# Patient Record
Sex: Female | Born: 1990 | Race: White | Hispanic: No | Marital: Single | Smoking: Never smoker
Health system: Southern US, Community
[De-identification: ages and names within clinical notes are randomized; demographics above are authoritative.]

## PROBLEM LIST (undated history)

## (undated) ENCOUNTER — Inpatient Hospital Stay: Payer: Self-pay

## (undated) DIAGNOSIS — F419 Anxiety disorder, unspecified: Secondary | ICD-10-CM

## (undated) DIAGNOSIS — J45909 Unspecified asthma, uncomplicated: Secondary | ICD-10-CM

## (undated) DIAGNOSIS — Z8489 Family history of other specified conditions: Secondary | ICD-10-CM

## (undated) DIAGNOSIS — F32A Depression, unspecified: Secondary | ICD-10-CM

## (undated) DIAGNOSIS — E282 Polycystic ovarian syndrome: Secondary | ICD-10-CM

## (undated) DIAGNOSIS — B009 Herpesviral infection, unspecified: Secondary | ICD-10-CM

## (undated) HISTORY — DX: Herpesviral infection, unspecified: B00.9

## (undated) HISTORY — DX: Depression, unspecified: F32.A

## (undated) HISTORY — DX: Anxiety disorder, unspecified: F41.9

## (undated) HISTORY — DX: Polycystic ovarian syndrome: E28.2

## (undated) HISTORY — PX: FOOT SURGERY: SHX648

## (undated) HISTORY — PX: FRACTURE SURGERY: SHX138

---
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
## 2013-05-13 IMAGING — CT CT ABD-PELV W/ CM
1 of 2 series · 15 of 32 positions shown, 19 images · IV contrast (OMNIPAQUE 300)
Comparison: CT of the abdomen and pelvis performed 02/07/2010

CLINICAL DATA: Right lower quadrant abdominal pain.

CT ABDOMEN AND PELVIS WITH CONTRAST
TECHNIQUE: Multidetector CT imaging of the abdomen and pelvis was
performed following the standard protocol during bolus
administration of intravenous contrast.
Contrast: 100mL OMNIPAQUE IOHEXOL 300 MG/ML  SOLN

[Series 2: abd/pel with · axial · 0.66mm/px · z∈[-444,-34]mm · 15 of 93 slices shown, 19 images]
[im 7/93  soft-tissue]
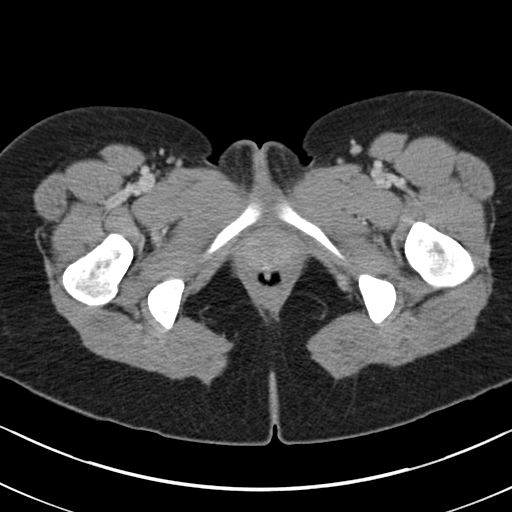
[im 7/93  bone]
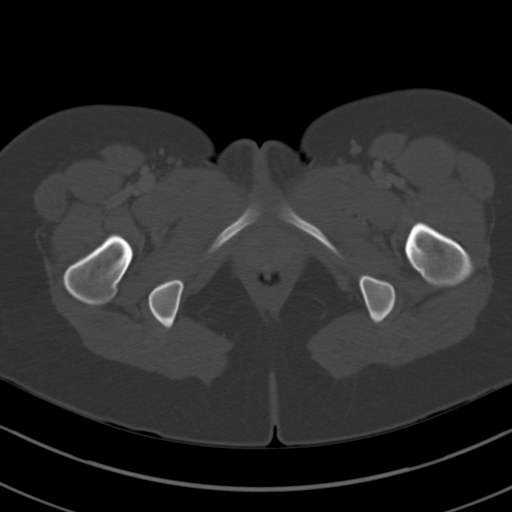
[im 14/93  soft-tissue]
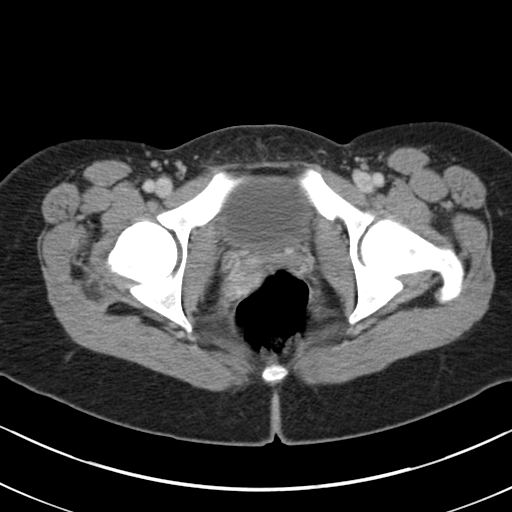
[im 21/93  soft-tissue]
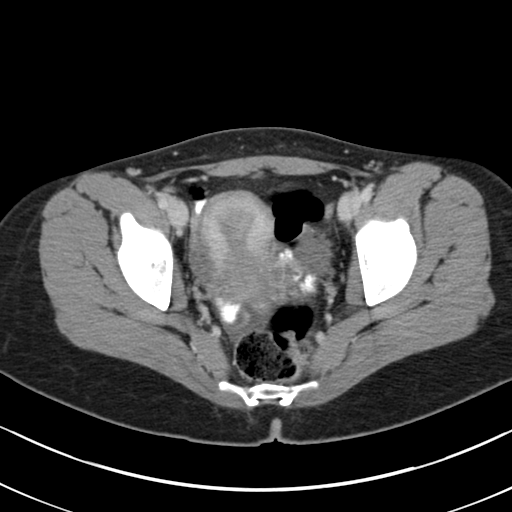
[im 28/93  soft-tissue]
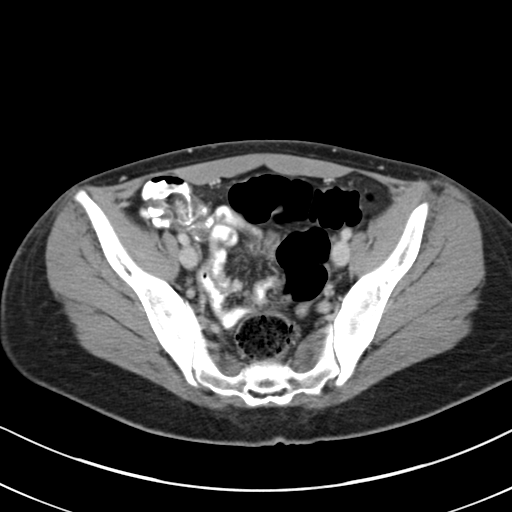
[im 35/93  soft-tissue]
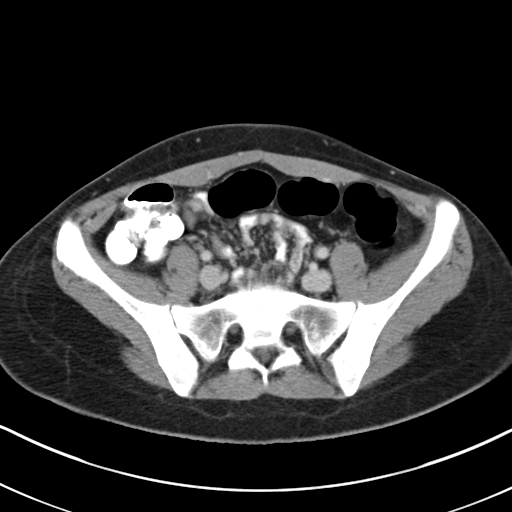
[im 41/93  soft-tissue]
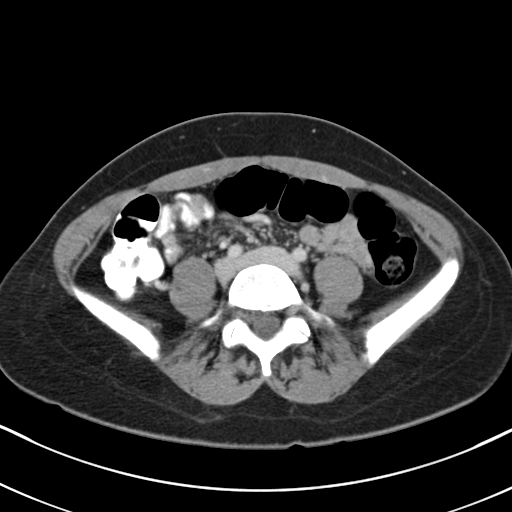
[im 48/93  soft-tissue]
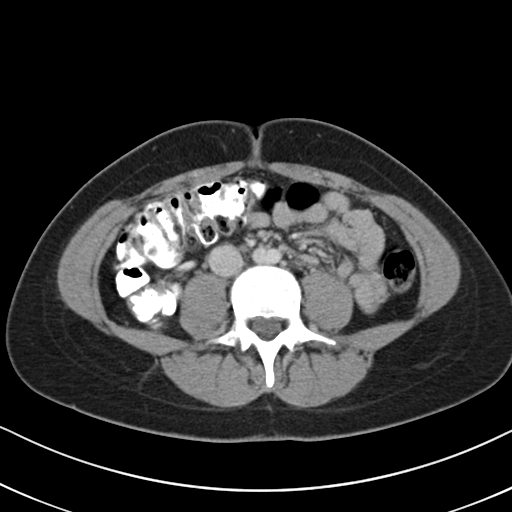
[im 55/93  soft-tissue]
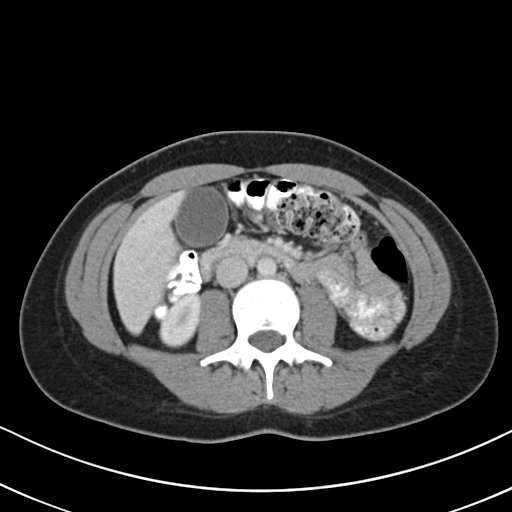
[im 62/93  soft-tissue]
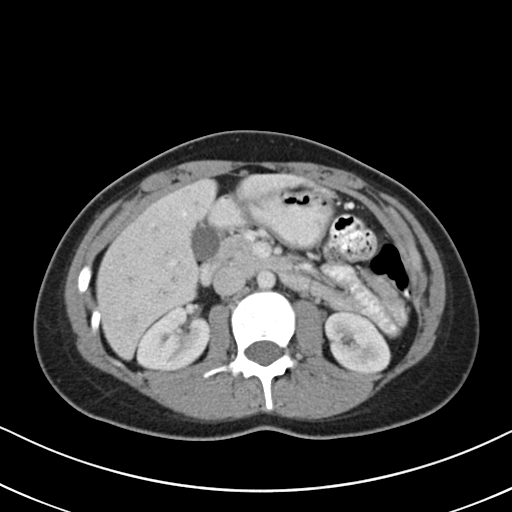
[im 62/93  bone]
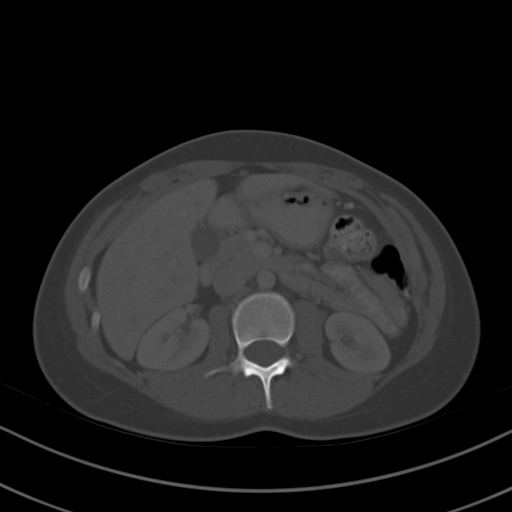
[im 69/93  soft-tissue]
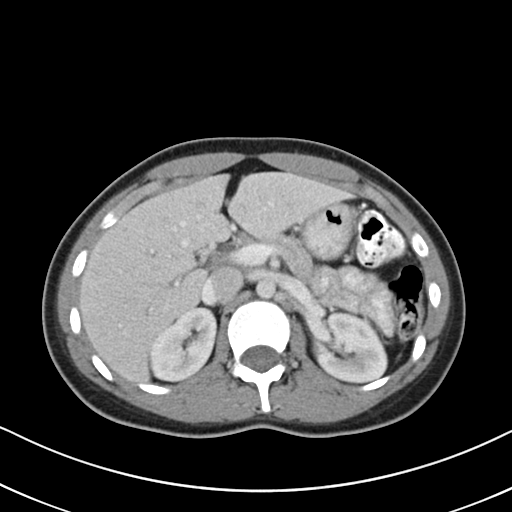
[im 75/93  soft-tissue]
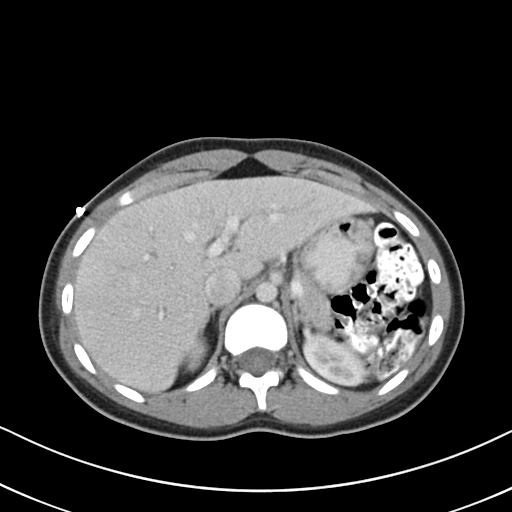
[im 79/93  lung]
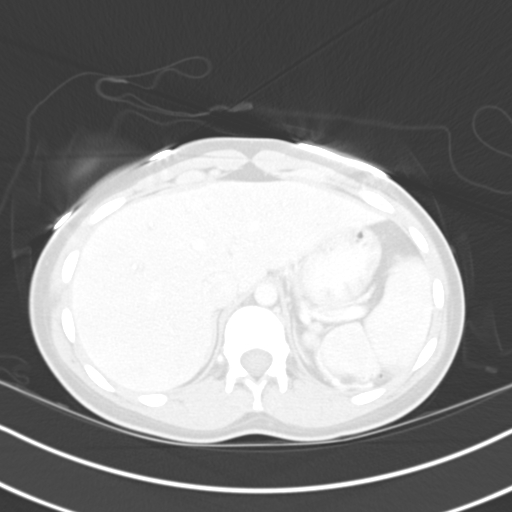
[im 82/93  soft-tissue]
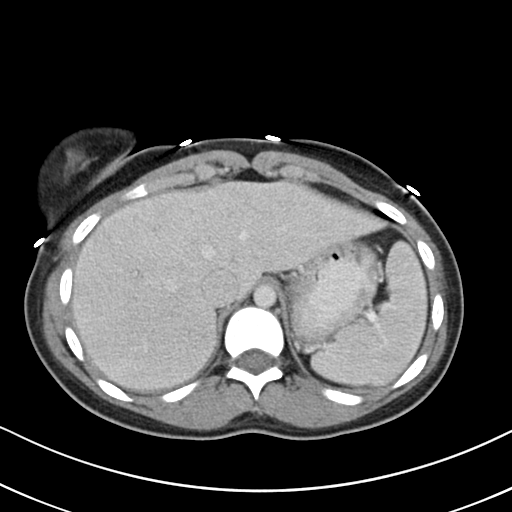
[im 82/93  lung]
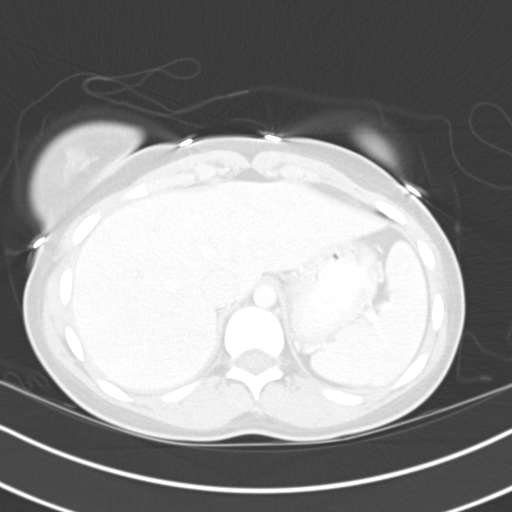
[im 86/93  lung]
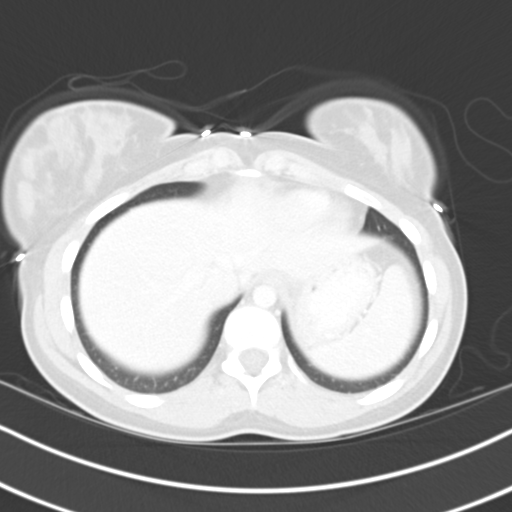
[im 89/93  soft-tissue]
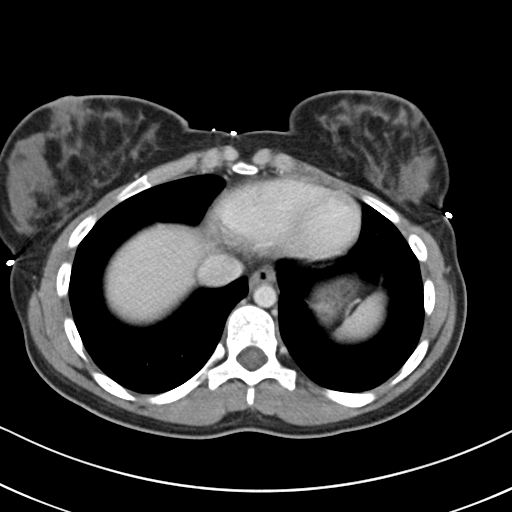
[im 89/93  lung]
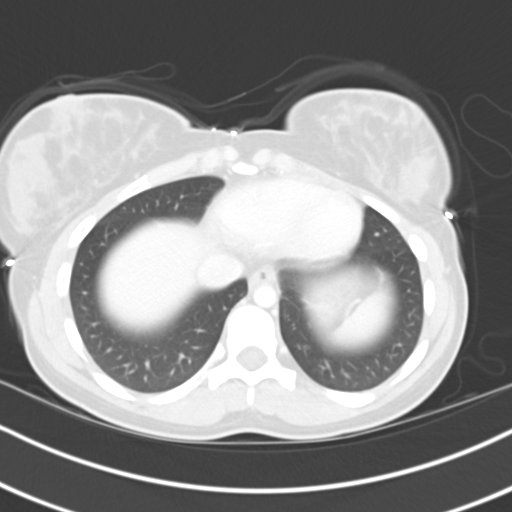

[15 of 32 positions shown; findings below may reference images not displayed]

FINDINGS: The visualized lung bases are clear.

Mild periportal edema may reflect volume repletion.  The liver is
otherwise unremarkable in appearance.  The spleen is within normal
limits.  The pancreas and adrenal glands are unremarkable.  The
gallbladder is grossly normal in appearance.

The kidneys are unremarkable in appearance.  There is no evidence
of hydronephrosis.  No renal or ureteral stones are seen.  No
perinephric stranding is appreciated.

The small bowel is unremarkable in appearance.  The stomach is
within normal limits.  No acute vascular abnormalities are seen.

The appendix is normal in caliber, without evidence for
appendicitis.  Contrast progresses to the level of the descending
colon.  The colon is unremarkable in appearance.

The bladder is mildly distended and grossly unremarkable in
appearance.  The uterus is within normal limits.  The ovaries are
relatively symmetric; no suspicious adnexal masses are seen.  Trace
free fluid within the pelvis is likely physiologic in nature.  No
inguinal lymphadenopathy is seen.

No acute osseous abnormalities are identified.
IMPRESSION: No acute abnormalities seen within the abdomen or pelvis.

## 2013-11-23 ENCOUNTER — Encounter: Payer: BC Managed Care – PPO | Admitting: Family Medicine

## 2013-11-23 ENCOUNTER — Encounter: Payer: Self-pay | Admitting: Family Medicine

## 2013-11-27 NOTE — Progress Notes (Signed)
This encounter was created in error - please disregard.

## 2013-11-28 ENCOUNTER — Ambulatory Visit (INDEPENDENT_AMBULATORY_CARE_PROVIDER_SITE_OTHER): Payer: BC Managed Care – PPO | Admitting: Family Medicine

## 2013-11-28 ENCOUNTER — Ambulatory Visit (HOSPITAL_BASED_OUTPATIENT_CLINIC_OR_DEPARTMENT_OTHER)
Admission: RE | Admit: 2013-11-28 | Discharge: 2013-11-28 | Disposition: A | Discharge status: Home / Self Care | Payer: BC Managed Care – PPO | Source: Ambulatory Visit | Attending: Family Medicine | Admitting: Family Medicine

## 2013-11-28 ENCOUNTER — Encounter: Payer: Self-pay | Admitting: Family Medicine

## 2013-11-28 VITALS — BP 113/75 | HR 86 | Ht 67.0 in | Wt 190.0 lb

## 2013-11-28 DIAGNOSIS — M25532 Pain in left wrist: Secondary | ICD-10-CM

## 2013-11-28 DIAGNOSIS — S6990XA Unspecified injury of unspecified wrist, hand and finger(s), initial encounter: Secondary | ICD-10-CM

## 2013-11-28 DIAGNOSIS — S59909A Unspecified injury of unspecified elbow, initial encounter: Secondary | ICD-10-CM

## 2013-11-28 DIAGNOSIS — S59919A Unspecified injury of unspecified forearm, initial encounter: Secondary | ICD-10-CM

## 2013-11-28 DIAGNOSIS — M25539 Pain in unspecified wrist: Secondary | ICD-10-CM | POA: Insufficient documentation

## 2013-11-28 DIAGNOSIS — M79609 Pain in unspecified limb: Secondary | ICD-10-CM

## 2013-11-28 DIAGNOSIS — M79642 Pain in left hand: Secondary | ICD-10-CM

## 2013-11-28 DIAGNOSIS — S6992XA Unspecified injury of left wrist, hand and finger(s), initial encounter: Secondary | ICD-10-CM

## 2013-11-28 NOTE — Patient Instructions (Signed)
Follow up with me in 2 weeks to remove cast, repeat x-rays. Elevate above your heart as needed for swelling. Ibuprofen, tylenol as needed for pain.

## 2013-12-04 ENCOUNTER — Encounter: Payer: Self-pay | Admitting: Family Medicine

## 2013-12-04 DIAGNOSIS — S6992XA Unspecified injury of left wrist, hand and finger(s), initial encounter: Secondary | ICD-10-CM | POA: Insufficient documentation

## 2013-12-04 NOTE — Progress Notes (Signed)
Patient ID: Sarah Hutchinson, female   DOB: Feb 02, 1991, 23 y.o.   MRN: [id number]  PCP: No primary provider on file.  Subjective:   HPI: Patient is a 23 y.o. female here for left wrist injury.  Patient reports on 5/26 she was jogging on a track when she fell, tried to catch herself sustaining FOOSH injury. Had x-rays at outside facility and told she had a hairline fracture - placed in sugar tong splint and referred here. Pain currently a 5/10. + swelling.  Past Medical History  Diagnosis Date  . Asthma     Current Outpatient Prescriptions on File Prior to Visit  Medication Sig Dispense Refill  . Albuterol Sulfate (VENTOLIN HFA IN) Inhale 2 puffs into the lungs daily.       . Norgestimate-Ethinyl Estradiol Triphasic (ORTHO TRI-CYCLEN LO) 0.18/0.215/0.25 MG-25 MCG tablet Take 1 tablet by mouth daily.      . promethazine (PHENERGAN) 25 MG tablet Take 1 tablet (25 mg total) by mouth every 6 (six) hours as needed for nausea.  20 tablet  0   No current facility-administered medications on file prior to visit.    History reviewed. No pertinent past surgical history.  No Known Allergies  History   Social History  . Marital Status: Single    Spouse Name: N/A    Number of Children: N/A  . Years of Education: N/A   Occupational History  . Not on file.   Social History Main Topics  . Smoking status: Never Smoker   . Smokeless tobacco: Not on file  . Alcohol Use: No  . Drug Use: No  . Sexual Activity: Not Currently   Other Topics Concern  . Not on file   Social History Narrative  . No narrative on file    No family history on file.  BP 113/75  Pulse 86  Ht 5\' 7"  (1.702 m)  Wt 190 lb (86.183 kg)  BMI 29.75 kg/m2  Review of Systems: See HPI above.    Objective:  Physical Exam:  Gen: NAD  Left wrist: Splint removed Mild swelling but no bruising dorsal wrist. No other deformity. TTP distal radius.  No other tenderness. Did not test ROM with suspected  fracture. NVI distally. Able to flex, extend, oppose digits.    Assessment & Plan:  1. Left wrist injury - consistent with distal radius fracture, non displaced.  Short arm cast placed today.  Expect 6 total weeks to heal.  Elevate, nsaids with tylenol as needed.  F/u in 2 weeks to remove cast, repeat radiographs.

## 2013-12-04 NOTE — Assessment & Plan Note (Signed)
consistent with distal radius fracture, non displaced.  Short arm cast placed today.  Expect 6 total weeks to heal.  Elevate, nsaids with tylenol as needed.  F/u in 2 weeks to remove cast, repeat radiographs.

## 2013-12-05 ENCOUNTER — Encounter: Payer: Self-pay | Admitting: Family Medicine

## 2013-12-05 ENCOUNTER — Ambulatory Visit (HOSPITAL_BASED_OUTPATIENT_CLINIC_OR_DEPARTMENT_OTHER)
Admission: RE | Admit: 2013-12-05 | Discharge: 2013-12-05 | Disposition: A | Discharge status: Home / Self Care | Payer: BC Managed Care – PPO | Source: Ambulatory Visit | Attending: Family Medicine | Admitting: Family Medicine

## 2013-12-05 ENCOUNTER — Ambulatory Visit (INDEPENDENT_AMBULATORY_CARE_PROVIDER_SITE_OTHER): Payer: BC Managed Care – PPO | Admitting: Family Medicine

## 2013-12-05 VITALS — BP 121/83 | HR 76 | Ht 67.0 in | Wt 190.0 lb

## 2013-12-05 DIAGNOSIS — M25532 Pain in left wrist: Secondary | ICD-10-CM

## 2013-12-05 DIAGNOSIS — S59909A Unspecified injury of unspecified elbow, initial encounter: Secondary | ICD-10-CM

## 2013-12-05 DIAGNOSIS — S6992XA Unspecified injury of left wrist, hand and finger(s), initial encounter: Secondary | ICD-10-CM

## 2013-12-05 DIAGNOSIS — S6990XA Unspecified injury of unspecified wrist, hand and finger(s), initial encounter: Secondary | ICD-10-CM

## 2013-12-05 DIAGNOSIS — S59919A Unspecified injury of unspecified forearm, initial encounter: Secondary | ICD-10-CM

## 2013-12-05 DIAGNOSIS — Z4789 Encounter for other orthopedic aftercare: Secondary | ICD-10-CM | POA: Insufficient documentation

## 2013-12-05 DIAGNOSIS — M25539 Pain in unspecified wrist: Secondary | ICD-10-CM

## 2013-12-06 ENCOUNTER — Encounter: Payer: Self-pay | Admitting: Family Medicine

## 2013-12-06 NOTE — Progress Notes (Signed)
Patient ID: Sarah Hutchinson, female   DOB: 10-01-1990, 23 y.o.   MRN: [id number]  PCP: No primary provider on file.  Subjective:   HPI: Patient is a 23 y.o. female here for left wrist injury.  6/2: Patient reports on 5/26 she was jogging on a track when she fell, tried to catch herself sustaining FOOSH injury. Had x-rays at outside facility and told she had a hairline fracture - placed in sugar tong splint and referred here. Pain currently a 5/10. + swelling.  6/9: Patient reports the cast has started bothering her a lot more past couple days. Worse with movements of thumb. Taking norco as needed. No other complaints.  Past Medical History  Diagnosis Date  . Asthma     Current Outpatient Prescriptions on File Prior to Visit  Medication Sig Dispense Refill  . Albuterol Sulfate (VENTOLIN HFA IN) Inhale 2 puffs into the lungs daily.       Marland Kitchen HYDROcodone-acetaminophen (NORCO/VICODIN) 5-325 MG per tablet       . Norgestimate-Ethinyl Estradiol Triphasic (ORTHO TRI-CYCLEN LO) 0.18/0.215/0.25 MG-25 MCG tablet Take 1 tablet by mouth daily.      . promethazine (PHENERGAN) 25 MG tablet Take 1 tablet (25 mg total) by mouth every 6 (six) hours as needed for nausea.  20 tablet  0   No current facility-administered medications on file prior to visit.    History reviewed. No pertinent past surgical history.  No Known Allergies  History   Social History  . Marital Status: Single    Spouse Name: N/A    Number of Children: N/A  . Years of Education: N/A   Occupational History  . Not on file.   Social History Main Topics  . Smoking status: Never Smoker   . Smokeless tobacco: Not on file  . Alcohol Use: No  . Drug Use: No  . Sexual Activity: Not Currently   Other Topics Concern  . Not on file   Social History Narrative  . No narrative on file    History reviewed. No pertinent family history.  BP 121/83  Pulse 76  Ht 5\' 7"  (1.702 m)  Wt 190 lb (86.183 kg)  BMI 29.75  kg/m2  Review of Systems: See HPI above.    Objective:  Physical Exam:  Gen: NAD  Left wrist: Cast removed Minimal swelling but no bruising dorsal wrist. No other deformity. TTP distal radius.  No other tenderness. Did not test ROM with suspected fracture. NVI distally. Able to flex, extend, oppose digits.    Assessment & Plan:  1. Left wrist injury - consistent with distal radius fracture, non displaced.  Repeat radiographs unchanged.  New short arm cast placed today with extra padding to prevent irritation at base of thumb.  F/u in 2 weeks.  Believe we can likely follow clinical healing at this point - repeat x-rays at 6 weeks out if she's still having issues.

## 2013-12-06 NOTE — Assessment & Plan Note (Signed)
consistent with distal radius fracture, non displaced.  Repeat radiographs unchanged.  New short arm cast placed today with extra padding to prevent irritation at base of thumb.  F/u in 2 weeks.  Believe we can likely follow clinical healing at this point - repeat x-rays at 6 weeks out if she's still having issues.

## 2013-12-12 ENCOUNTER — Ambulatory Visit: Payer: BC Managed Care – PPO | Admitting: Family Medicine

## 2013-12-19 ENCOUNTER — Ambulatory Visit (INDEPENDENT_AMBULATORY_CARE_PROVIDER_SITE_OTHER): Payer: BC Managed Care – PPO | Admitting: Family Medicine

## 2013-12-19 ENCOUNTER — Encounter: Payer: Self-pay | Admitting: Family Medicine

## 2013-12-19 VITALS — BP 122/83 | HR 98 | Ht 67.0 in | Wt 195.0 lb

## 2013-12-19 DIAGNOSIS — S6990XA Unspecified injury of unspecified wrist, hand and finger(s), initial encounter: Secondary | ICD-10-CM

## 2013-12-19 DIAGNOSIS — S59909A Unspecified injury of unspecified elbow, initial encounter: Secondary | ICD-10-CM

## 2013-12-19 DIAGNOSIS — S59919A Unspecified injury of unspecified forearm, initial encounter: Secondary | ICD-10-CM

## 2013-12-19 DIAGNOSIS — Z5189 Encounter for other specified aftercare: Secondary | ICD-10-CM

## 2013-12-19 DIAGNOSIS — S6992XD Unspecified injury of left wrist, hand and finger(s), subsequent encounter: Secondary | ICD-10-CM

## 2013-12-20 ENCOUNTER — Encounter: Payer: Self-pay | Admitting: Family Medicine

## 2013-12-20 NOTE — Assessment & Plan Note (Signed)
consistent with distal radius fracture, non displaced.  New cast placed today and will f/u when she is 6 weeks out.  Repeat x-rays if she's still having pain.

## 2013-12-20 NOTE — Progress Notes (Signed)
Patient ID: Sarah LimeCaitlin Hutchinson, female   DOB: 09-16-1990, 23 y.o.   MRN: [id number]  PCP: No primary provider on file.  Subjective:   HPI: Patient is a 23 y.o. female here for left wrist injury.  6/2: Patient reports on 5/26 she was jogging on a track when she fell, tried to catch herself sustaining FOOSH injury. Had x-rays at outside facility and told she had a hairline fracture - placed in sugar tong splint and referred here. Pain currently a 5/10. + swelling.  6/9: Patient reports the cast has started bothering her a lot more past couple days. Worse with movements of thumb. Taking norco as needed. No other complaints.  6/23: Patient reports she has a little pain that comes and goes, mostly in the morning. Some swelling into fingers. No other complaints.  Past Medical History  Diagnosis Date  . Asthma     Current Outpatient Prescriptions on File Prior to Visit  Medication Sig Dispense Refill  . Albuterol Sulfate (VENTOLIN HFA IN) Inhale 2 puffs into the lungs daily.       Marland Kitchen. HYDROcodone-acetaminophen (NORCO/VICODIN) 5-325 MG per tablet       . Norgestimate-Ethinyl Estradiol Triphasic (ORTHO TRI-CYCLEN LO) 0.18/0.215/0.25 MG-25 MCG tablet Take 1 tablet by mouth daily.      . promethazine (PHENERGAN) 25 MG tablet Take 1 tablet (25 mg total) by mouth every 6 (six) hours as needed for nausea.  20 tablet  0   No current facility-administered medications on file prior to visit.    History reviewed. No pertinent past surgical history.  No Known Allergies  History   Social History  . Marital Status: Single    Spouse Name: N/A    Number of Children: N/A  . Years of Education: N/A   Occupational History  . Not on file.   Social History Main Topics  . Smoking status: Never Smoker   . Smokeless tobacco: Not on file  . Alcohol Use: No  . Drug Use: No  . Sexual Activity: Not Currently   Other Topics Concern  . Not on file   Social History Narrative  . No narrative on  file    History reviewed. No pertinent family history.  BP 122/83  Pulse 98  Ht 5\' 7"  (1.702 m)  Wt 195 lb (88.451 kg)  BMI 30.53 kg/m2  Review of Systems: See HPI above.    Objective:  Physical Exam:  Gen: NAD  Left wrist: Cast removed Minimal swelling but no bruising dorsal wrist. No other deformity. Minimal TTP distal radius.  No other tenderness. Did not test ROM with suspected fracture. NVI distally. Able to flex, extend, oppose digits.    Assessment & Plan:  1. Left wrist injury - consistent with distal radius fracture, non displaced.  New cast placed today and will f/u when she is 6 weeks out.  Repeat x-rays if she's still having pain.

## 2014-01-02 ENCOUNTER — Encounter: Payer: Self-pay | Admitting: Family Medicine

## 2014-01-02 ENCOUNTER — Ambulatory Visit (INDEPENDENT_AMBULATORY_CARE_PROVIDER_SITE_OTHER): Payer: BC Managed Care – PPO | Admitting: Family Medicine

## 2014-01-02 VITALS — BP 132/85 | HR 80 | Ht 67.0 in | Wt 195.0 lb

## 2014-01-02 DIAGNOSIS — S59909A Unspecified injury of unspecified elbow, initial encounter: Secondary | ICD-10-CM

## 2014-01-02 DIAGNOSIS — Z5189 Encounter for other specified aftercare: Secondary | ICD-10-CM

## 2014-01-02 DIAGNOSIS — S59919A Unspecified injury of unspecified forearm, initial encounter: Secondary | ICD-10-CM

## 2014-01-02 DIAGNOSIS — S6992XD Unspecified injury of left wrist, hand and finger(s), subsequent encounter: Secondary | ICD-10-CM

## 2014-01-02 DIAGNOSIS — S6990XA Unspecified injury of unspecified wrist, hand and finger(s), initial encounter: Secondary | ICD-10-CM

## 2014-01-02 NOTE — Assessment & Plan Note (Signed)
consistent with distal radius fracture, non displaced.  6 weeks out and clinically completely healed.  Advised to start easy motion exercises which I demonstrated to her today.  Consider PT/OT if she is struggling.  F/u prn.

## 2014-01-02 NOTE — Progress Notes (Signed)
Patient ID: Sarah Hutchinson, female   DOB: 08/07/1990, 23 y.o.   MRN: [id number]  PCP: No primary provider on file.  Subjective:   HPI: Patient is a 23 y.o. female here for left wrist injury.  6/2: Patient reports on 5/26 she was jogging on a track when she fell, tried to catch herself sustaining FOOSH injury. Had x-rays at outside facility and told she had a hairline fracture - placed in sugar tong splint and referred here. Pain currently a 5/10. + swelling.  6/9: Patient reports the cast has started bothering her a lot more past couple days. Worse with movements of thumb. Taking norco as needed. No other complaints.  6/23: Patient reports she has a little pain that comes and goes, mostly in the morning. Some swelling into fingers. No other complaints.  7/7: Last pain was about a week ago. Doing well in cast, no longer with swelling. No other complaints.  Past Medical History  Diagnosis Date  . Asthma     Current Outpatient Prescriptions on File Prior to Visit  Medication Sig Dispense Refill  . Albuterol Sulfate (VENTOLIN HFA IN) Inhale 2 puffs into the lungs daily.       . Norgestimate-Ethinyl Estradiol Triphasic (ORTHO TRI-CYCLEN LO) 0.18/0.215/0.25 MG-25 MCG tablet Take 1 tablet by mouth daily.      . promethazine (PHENERGAN) 25 MG tablet Take 1 tablet (25 mg total) by mouth every 6 (six) hours as needed for nausea.  20 tablet  0   No current facility-administered medications on file prior to visit.    History reviewed. No pertinent past surgical history.  No Known Allergies  History   Social History  . Marital Status: Single    Spouse Name: N/A    Number of Children: N/A  . Years of Education: N/A   Occupational History  . Not on file.   Social History Main Topics  . Smoking status: Never Smoker   . Smokeless tobacco: Not on file  . Alcohol Use: No  . Drug Use: No  . Sexual Activity: Not Currently   Other Topics Concern  . Not on file   Social  History Narrative  . No narrative on file    History reviewed. No pertinent family history.  BP 132/85  Pulse 80  Ht 5\' 7"  (1.702 m)  Wt 195 lb (88.451 kg)  BMI 30.53 kg/m2  Review of Systems: See HPI above.    Objective:  Physical Exam:  Gen: NAD  Left wrist: Cast removed No swelling or bruising. No other deformity. No tenderness distal radius.  No other tenderness. Mod limited flexion and extension of wrist, not painful. NVI distally. Able to flex, extend, oppose digits.    Assessment & Plan:  1. Left wrist injury - consistent with distal radius fracture, non displaced.  6 weeks out and clinically completely healed.  Advised to start easy motion exercises which I demonstrated to her today.  Consider PT/OT if she is struggling.  F/u prn.

## 2014-06-29 NOTE — L&D Delivery Note (Addendum)
Delivery Note  First Stage: Labor onset: 06/16/15 @ 0000 Augmentation : AROM Analgesia Eliezer Lofts intrapartum: epidural AROM at 0850 - clear fluid  Second Stage: Complete dilation at 1110 Onset of pushing at 1110 FHR second stage : baseline: 120 bpm / moderate variability / +accels / variable and early decelerations with good return to baseline   Delivery of a viable female "[employer]" at 1126 by Carlean Jews, CNM in LOA position No nuchal cord Cord double clamped after cessation of pulsation, cut by mother of the patient  Third Stage: Placenta delivered via Tomasa Blase intact with 3 VC @ 1134 Placenta disposition: hospital disposal  Uterine tone firm with Pitocin bolus and massage / bleeding was brisk and slowed with massage and Pitcoin  Bilateral labial laceration identified  Anesthesia for repair: epidural and 1% Lidocaine Repair: 3.0 vicryl with good hemostasis  Est. Blood Loss (mL): 350cc  Complications: none  Mom to postpartum.  Baby to Couplet care / Skin to Skin.  Newborn: Birth Weight: 3190 grams (7#0.5oz) Apgar Scores: 8, 9 Feeding planned: Breast  Carlean Jews, CNM

## 2014-09-15 ENCOUNTER — Emergency Department: Payer: Self-pay

## 2014-11-15 LAB — OB RESULTS CONSOLE RUBELLA ANTIBODY, IGM: RUBELLA: IMMUNE

## 2014-11-15 LAB — OB RESULTS CONSOLE ANTIBODY SCREEN: Antibody Screen: NEGATIVE

## 2014-11-15 LAB — OB RESULTS CONSOLE HEPATITIS B SURFACE ANTIGEN: Hepatitis B Surface Ag: NEGATIVE

## 2014-11-15 LAB — OB RESULTS CONSOLE HIV ANTIBODY (ROUTINE TESTING): HIV: NONREACTIVE

## 2014-11-15 LAB — OB RESULTS CONSOLE RPR: RPR: NONREACTIVE

## 2014-11-15 LAB — OB RESULTS CONSOLE ABO/RH: RH Type: POSITIVE

## 2014-11-15 LAB — OB RESULTS CONSOLE VARICELLA ZOSTER ANTIBODY, IGG: VARICELLA IGG: IMMUNE

## 2014-11-15 LAB — OB RESULTS CONSOLE GC/CHLAMYDIA: Chlamydia: POSITIVE

## 2014-12-06 LAB — OB RESULTS CONSOLE GC/CHLAMYDIA: CHLAMYDIA, DNA PROBE: NEGATIVE

## 2014-12-13 ENCOUNTER — Other Ambulatory Visit: Payer: Self-pay | Admitting: Obstetrics and Gynecology

## 2014-12-13 DIAGNOSIS — Z3491 Encounter for supervision of normal pregnancy, unspecified, first trimester: Secondary | ICD-10-CM

## 2015-02-24 ENCOUNTER — Inpatient Hospital Stay: Payer: Medicaid Other

## 2015-02-24 ENCOUNTER — Observation Stay
Admission: EM | Admit: 2015-02-24 | Discharge: 2015-02-24 | Disposition: A | Discharge status: Home / Self Care | Payer: Medicaid Other | Attending: Obstetrics and Gynecology | Admitting: Obstetrics and Gynecology

## 2015-02-24 DIAGNOSIS — R109 Unspecified abdominal pain: Secondary | ICD-10-CM | POA: Diagnosis present

## 2015-02-24 DIAGNOSIS — Z3A23 23 weeks gestation of pregnancy: Secondary | ICD-10-CM | POA: Diagnosis not present

## 2015-02-24 DIAGNOSIS — O26892 Other specified pregnancy related conditions, second trimester: Principal | ICD-10-CM | POA: Insufficient documentation

## 2015-02-24 DIAGNOSIS — R1031 Right lower quadrant pain: Secondary | ICD-10-CM | POA: Diagnosis present

## 2015-02-24 DIAGNOSIS — O26899 Other specified pregnancy related conditions, unspecified trimester: Secondary | ICD-10-CM

## 2015-02-24 HISTORY — DX: Unspecified asthma, uncomplicated: J45.909

## 2015-02-24 LAB — COMPREHENSIVE METABOLIC PANEL
ALT: 11 U/L — AB (ref 14–54)
AST: 21 U/L (ref 15–41)
Albumin: 3 g/dL — ABNORMAL LOW (ref 3.5–5.0)
Alkaline Phosphatase: 71 U/L (ref 38–126)
Anion gap: 7 (ref 5–15)
BUN: 7 mg/dL (ref 6–20)
CO2: 23 mmol/L (ref 22–32)
CREATININE: 0.51 mg/dL (ref 0.44–1.00)
Calcium: 8.6 mg/dL — ABNORMAL LOW (ref 8.9–10.3)
Chloride: 108 mmol/L (ref 101–111)
Glucose, Bld: 85 mg/dL (ref 65–99)
Potassium: 3.3 mmol/L — ABNORMAL LOW (ref 3.5–5.1)
Sodium: 138 mmol/L (ref 135–145)
Total Bilirubin: 0.3 mg/dL (ref 0.3–1.2)
Total Protein: 6.2 g/dL — ABNORMAL LOW (ref 6.5–8.1)

## 2015-02-24 LAB — CBC WITH DIFFERENTIAL/PLATELET
Basophils Absolute: 0 10*3/uL (ref 0–0.1)
Basophils Relative: 0 %
EOS PCT: 1 %
Eosinophils Absolute: 0.1 10*3/uL (ref 0–0.7)
HCT: 32.4 % — ABNORMAL LOW (ref 35.0–47.0)
Hemoglobin: 10.9 g/dL — ABNORMAL LOW (ref 12.0–16.0)
LYMPHS ABS: 2 10*3/uL (ref 1.0–3.6)
LYMPHS PCT: 22 %
MCH: 28.9 pg (ref 26.0–34.0)
MCHC: 33.6 g/dL (ref 32.0–36.0)
MCV: 86.2 fL (ref 80.0–100.0)
MONO ABS: 0.5 10*3/uL (ref 0.2–0.9)
MONOS PCT: 5 %
Neutro Abs: 6.4 10*3/uL (ref 1.4–6.5)
Neutrophils Relative %: 72 %
PLATELETS: 200 10*3/uL (ref 150–440)
RBC: 3.76 MIL/uL — ABNORMAL LOW (ref 3.80–5.20)
RDW: 14 % (ref 11.5–14.5)
WBC: 8.9 10*3/uL (ref 3.6–11.0)

## 2015-02-24 LAB — URINALYSIS COMPLETE WITH MICROSCOPIC (ARMC ONLY)
BILIRUBIN URINE: NEGATIVE
GLUCOSE, UA: NEGATIVE mg/dL
Hgb urine dipstick: NEGATIVE
Ketones, ur: NEGATIVE mg/dL
Leukocytes, UA: NEGATIVE
Nitrite: NEGATIVE
Protein, ur: NEGATIVE mg/dL
Specific Gravity, Urine: 1.008 (ref 1.005–1.030)
pH: 8 (ref 5.0–8.0)

## 2015-02-24 MED ORDER — LACTATED RINGERS IV BOLUS (SEPSIS): 1000.0000 mL | Freq: Once | INTRAVENOUS | Status: DC

## 2015-02-24 MED ORDER — BISACODYL 5 MG PO TBEC
5.0000 mg | DELAYED_RELEASE_TABLET | Freq: Every day | ORAL | Status: DC | PRN
  Administered 2015-02-24: 5 mg via ORAL
  Filled 2015-02-24 (×2): qty 1

## 2015-02-24 MED ORDER — FLEET ENEMA 7-19 GM/118ML RE ENEM
1.0000 | ENEMA | Freq: Once | RECTAL | Status: AC
  Administered 2015-02-24: 1 via RECTAL

## 2015-02-24 NOTE — Progress Notes (Signed)
Sarah Hutchinson taken to U/S via bed.

## 2015-02-24 NOTE — Progress Notes (Addendum)
Obstetrics Admission History & Physical  Referring Provider: [facility] Primary OBGYN [facility]   Chief Complaint: c/o "periumbilical pain starting 3 days ago under the umbilicus area which tonight at 0100 woke her up as it escalated to a 10". The pain continued in the periumbilical area and moved towards the RT side proceeding down the Rt lower quadrant. The pain is now at a 6.5 but, is coming and going in waves. It hurts to move, pt has vomited 2 times. Pt can sit still and the pain is better and when she moves it becomes more. No nausea, no fever, no aching, no sweats. Normal BM's with last one yest which caused no increase of pain. Pt is pregnant and feels the baby moving all the time. Pt has taken no meds for the pain.  History of Present Illness  24 y.o. G1P0 @ [redacted]w[redacted]d dated by LMP . Pregnancy complicated by: hx of chlamydia.   Sarah Hutchinson presents for  RLQ pain.   Review of Systems: Positive for abdominal pain, +vomiting, no nausea..  Otherwise, her 12 point review of systems is negative or as noted in the History of Present Illness.   PMHx:Asthma  PSHx:Foot surgery Medications:  Prescriptions prior to admission  Medication Sig Dispense Refill Last Dose  . albuterol (PROVENTIL) (2.5 MG/3ML) 0.083% nebulizer solution Take 2.5 mg by nebulization every 6 (six) hours as needed for wheezing or shortness of breath.      Allergies: has No Known Allergies. OBHx:  OB History  Gravida Para Term Preterm AB SAB TAB Ectopic Multiple Living  1             # Outcome Date GA Lbr Len/2nd Weight Sex Delivery Anes PTL Lv  1 Current              GYNHx:  History of abnormal pap smears: No. History of STIs: No..             FHx:Mom: DM, HTN,  Mat aunt: DM, HTN Soc Hx: NS, No ETOH or drug usage Social History   Social History  . Marital Status: Single    Spouse Name: N/A  . Number of Children: N/A  . Years of Education: N/A   Occupational History  . Not on file.   Social History Main  Topics  . Smoking status: Not on file  . Smokeless tobacco: Not on file  . Alcohol Use: Not on file  . Drug Use: Not on file  . Sexual Activity: Not on file   Other Topics Concern  . Not on file   Social History Narrative  . No narrative on file   FOB is a 24 yo black female in good health.    Objective  Afebrile  VSS. No data recorded.  No intake or output data in the 24 hours ending 02/24/15 0413   RR   No Data Recorded  SaO2     No Data Recorded       24 Hour I/O Current Shift I/O  Time Ins Outs       EFM: 140, fetus is very active and difficult to keep on the monitor Toco: no UC's noted.   General: Well nourished, well developed female in no acute distress.  Skin:  Warm and dry.  Cardiovascular: S1S2, RRR, No M/R/G. Respiratory:  Clear to auscultation bilateral. Normal respiratory effort. No W/R/R. Abdomen: Gravid, +FM. Neuro/Psych:  Normal mood and affect.    SVE: deferred as pt is in pain  and not in labor. Leopolds/EFW:1#6 oz  Labs  CBC, CMP, Type and screen,urine Ultrasounds   Perinatal info  [place]: WNL at 120 wk scan  Assessment & Plan   24 y.o. G1P0 @ [redacted]w[redacted]d with signs and symptoms, with likely Appendix vs. kidney infection.  IUP:  At 23 3/7 weeks GBS: not done yet Analgesia: none given  A: IUP at 23 3/7 weeks 2.  RLQ pain P: Urine, CBC, CMP,  2. Start IV with LR at 125 ml/hr after 1 liter bolus 3. Will get OB US 4. May need CT of abdomen Will wait till labs are back and US done  Sharee Pimple, MSN, CNM, FNP [facility]

## 2015-02-24 NOTE — Discharge Summary (Signed)
Obstetric Discharge Summary   Patient ID: Sarah Hutchinson MRN: [id number] DOB/AGE: May 31, 1991 24 y.o.   Date of Admission: 02/24/2015  Date of Discharge: 02/24/15  Admitting Diagnosis: IUP at 23 3/7 weeks withRLQ pain at [redacted]w[redacted]d  Secondary Diagnosis: Constipation  Mode of Delivery: Antepartum     Discharge Diagnosis: IUP at 23 3/7 weeks with constipation  Intrapartum Procedures: Fleets enema   Post partum procedures: N/A  Complications: none   Brief Hospital Course  Sarah Hutchinson is a G1P0 who had RLQ pain and was tx for constipation with Fleets enema and pain is now resolved,  By time of discharge, her pain was controlled after the enema. voiding without difficulty and tolerating regular diet.  She was deemed stable for discharge to home.     Labs: CBC Latest Ref Rng 02/24/2015  WBC 3.6 - 11.0 K/uL 8.9  Hemoglobin 12.0 - 16.0 g/dL 10.9(L)  Hematocrit 35.0 - 47.0 % 32.4(L)  Platelets 150 - 440 K/uL 200    Physical exam:  Blood pressure 115/68, pulse 88, temperature 98.6 F (37 C), temperature source Oral, resp. rate 12. General: alert and no distress Abdomen: Gravid  Discharge Instructions: Per After Visit Summary. Activity: Advance as tolerated.Diet: Regular Medications:   Medication List    ASK your doctor about these medications        albuterol (2.5 MG/3ML) 0.083% nebulizer solution  Commonly known as:  PROVENTIL  Take 2.5 mg by nebulization every 6 (six) hours as needed for wheezing or shortness of breath.       Outpatient follow up:      Follow-up Information    Follow up with [facility] on 02/25/2015.   Why:  Follow up with Milon Score, CNM      Follow up with Sharee Pimple, CNM In 4 days.   Specialty:  Obstetrics and Gynecology   Contact information:   [address] [place] [address] [phone]       Discharged Condition: stable  Discharged to: home   [facility]    Disposition:home    Sharee Pimple,  [place] 02/24/2015

## 2015-02-24 NOTE — Progress Notes (Signed)
Per Rolan Bucco Jones,CNM patient may be off monitor.

## 2015-02-24 NOTE — OB Triage Note (Signed)
Patient presented with Abdominal pain at the navel  for 3 -4 days that has progressively gotten worse.  C/o pain that radiates from navel to right side and lower abdominal cramping that started last night.  Denies any vaginal bleeding, or leaking of fluid, reports normal fetal movement.

## 2015-02-24 NOTE — Progress Notes (Signed)
Pt is feeling much improved after having a BM after the Fleets enema. Pt wants to go home. Pt will fu with me this week and call for any concerns.

## 2015-02-24 NOTE — Progress Notes (Signed)
S:Still feeling the pain in the RLQ. Feels it is a 7 on 1-10 scale. Feels like it is intense now that she had the scan. OCeasar Mons Vitals:   02/24/15 0250 02/24/15 0932  BP: 120/70 115/68  Pulse: 101 88  Temp: 98.3 F (36.8 C) 98.6 F (37 C)  TempSrc: Oral Oral  Resp: 18 12  Gen: NAD, AAOx3      Abd: FNTTP      Ext: Non-tender, Nonedmeatous    FHT: 140   TOCO: no UC's earlier SVE:not examined   A/P:  24 y.o. yo G1P0 at [redacted]w[redacted]d for RLQ  Pain.   Labor:Not in labor  FWB: Reassuring   GBS: unknown  P: Fleets enema to see if pain resolves. Start on Ducolax. Dr Feliberto Gottron is aware of the MRI results and the diagnosis of poss constipation, no appendicitis, Trace fluid of the Rt adnexa. Rt pelviectasis,  Suspicion of pancreas divisum.   Sharee Pimple 1:37 PM

## 2015-02-24 NOTE — Progress Notes (Signed)
S: Still hurting in the RLQ about a 7 on 1-10 scale. +Nausea, Vomited x 2. Pain worsens with movement.  O: Afebrile. T98.6-88-12 BP 115/68. Abd: Gravid, FH: 23 cms. No tenderness to palp at the umbilical area but, TTP in the RLQ. Neg psoas and neg Rovsings.  Urine is neg but, has 0-5 RBC's. Disc with Dr Feliberto Gottron and agrees with MRI. Pt accepts the plan and aware of the risks of radiology. A: 1.IUP at 23 3/7 weeks 2. RLQ pain P: MRI of abd and pelvis w/o contrast.

## 2015-02-24 NOTE — Discharge Instructions (Signed)
Docusate capsules What is this medicine? DOCUSATE (doc CUE sayt) is stool softener. It helps prevent constipation and straining or discomfort associated with hard or dry stools. This medicine may be used for other purposes; ask your health care provider or pharmacist if you have questions. COMMON BRAND NAME(S): Colace, Colace Clear, Correctol, D.O.S., DC, Doc-Q-Lace, DocuLace, Docusoft S, DOK, Dulcolax, Genasoft, Kao-Tin, Kaopectate Liqui-Gels, Phillips Stool Softener, Stool Softener, Stool Softner DC, Sulfolax, Sur-Q-Lax, Surfak, Uni-Ease What should I tell my health care provider before I take this medicine? They need to know if you have any of these conditions: -nausea or vomiting -severe constipation -stomach pain -sudden change in bowel habit lasting more than 2 weeks -an unusual or allergic reaction to docusate, other medicines, foods, dyes, or preservatives -pregnant or trying to get pregnant -breast-feeding How should I use this medicine? Take this medicine by mouth with a glass of water. Follow the directions on the label. Take your doses at regular intervals. Do not take your medicine more often than directed. Talk to your pediatrician regarding the use of this medicine in children. While this medicine may be prescribed for children as young as 2 years for selected conditions, precautions do apply. Overdosage: If you think you have taken too much of this medicine contact a poison control center or emergency room at once. NOTE: This medicine is only for you. Do not share this medicine with others. What if I miss a dose? If you miss a dose, take it as soon as you can. If it is almost time for your next dose, take only that dose. Do not take double or extra doses. What may interact with this medicine? -mineral oil This list may not describe all possible interactions. Give your health care provider a list of all the medicines, herbs, non-prescription drugs, or dietary supplements you  use. Also tell them if you smoke, drink alcohol, or use illegal drugs. Some items may interact with your medicine. What should I watch for while using this medicine? Do not use for more than one week without advice from your doctor or health care professional. If your constipation returns, check with your doctor or health care professional. Drink plenty of water while taking this medicine. Drinking water helps decrease constipation. Stop using this medicine and contact your doctor or health care professional if you experience any rectal bleeding or do not have a bowel movement after use. These could be signs of a more serious condition. What side effects may I notice from receiving this medicine? Side effects that you should report to your doctor or health care professional as soon as possible: -allergic reactions like skin rash, itching or hives, swelling of the face, lips, or tongue Side effects that usually do not require medical attention (report to your doctor or health care professional if they continue or are bothersome): -diarrhea -stomach cramps -throat irritation This list may not describe all possible side effects. Call your doctor for medical advice about side effects. You may report side effects to FDA at 1-800-FDA-1088. Where should I keep my medicine? Keep out of the reach of children. Store at room temperature between 15 and 30 degrees C (59 and 86 degrees F). Throw away any unused medicine after the expiration date. NOTE: This sheet is a summary. It may not cover all possible information. If you have questions about this medicine, talk to your doctor, pharmacist, or health care provider.  2015, Elsevier/Gold Standard. (2007-10-06 15:56:49) Constipation Constipation is when a person has fewer than   three bowel movements a week, has difficulty having a bowel movement, or has stools that are dry, hard, or larger than normal. As people grow older, constipation is more common. If you  try to fix constipation with medicines that make you have a bowel movement (laxatives), the problem may get worse. Long-term laxative use may cause the muscles of the colon to become weak. A low-fiber diet, not taking in enough fluids, and taking certain medicines may make constipation worse.  CAUSES   Certain medicines, such as antidepressants, pain medicine, iron supplements, antacids, and water pills.   Certain diseases, such as diabetes, irritable bowel syndrome (IBS), thyroid disease, or depression.   Not drinking enough water.   Not eating enough fiber-rich foods.   Stress or travel.   Lack of physical activity or exercise.   Ignoring the urge to have a bowel movement.   Using laxatives too much.  SIGNS AND SYMPTOMS   Having fewer than three bowel movements a week.   Straining to have a bowel movement.   Having stools that are hard, dry, or larger than normal.   Feeling full or bloated.   Pain in the lower abdomen.   Not feeling relief after having a bowel movement.  DIAGNOSIS  Your health care provider will take a medical history and perform a physical exam. Further testing may be done for severe constipation. Some tests may include:  A barium enema X-ray to examine your rectum, colon, and, sometimes, your small intestine.   A sigmoidoscopy to examine your lower colon.   A colonoscopy to examine your entire colon. TREATMENT  Treatment will depend on the severity of your constipation and what is causing it. Some dietary treatments include drinking more fluids and eating more fiber-rich foods. Lifestyle treatments may include regular exercise. If these diet and lifestyle recommendations do not help, your health care provider may recommend taking over-the-counter laxative medicines to help you have bowel movements. Prescription medicines may be prescribed if over-the-counter medicines do not work.  HOME CARE INSTRUCTIONS   Eat foods that have a lot of  fiber, such as fruits, vegetables, whole grains, and beans.  Limit foods high in fat and processed sugars, such as french fries, hamburgers, cookies, candies, and soda.   A fiber supplement may be added to your diet if you cannot get enough fiber from foods.   Drink enough fluids to keep your urine clear or pale yellow.   Exercise regularly or as directed by your health care provider.   Go to the restroom when you have the urge to go. Do not hold it.   Only take over-the-counter or prescription medicines as directed by your health care provider. Do not take other medicines for constipation without talking to your health care provider first.  SEEK IMMEDIATE MEDICAL CARE IF:   You have bright red blood in your stool.   Your constipation lasts for more than 4 days or gets worse.   You have abdominal or rectal pain.   You have thin, pencil-like stools.   You have unexplained weight loss. MAKE SURE YOU:   Understand these instructions.  Will watch your condition.  Will get help right away if you are not doing well or get worse. Document Released: 03/13/2004 Document Revised: 06/20/2013 Document Reviewed: 03/27/2013 ExitCare Patient Information 2015 [employer]. This information is not intended to replace advice given to you by your health care provider. Make sure you discuss any questions you have with your health care provider.  

## 2015-02-25 ENCOUNTER — Encounter (HOSPITAL_COMMUNITY): Payer: Self-pay

## 2015-02-25 ENCOUNTER — Encounter: Payer: Self-pay | Admitting: Family Medicine

## 2015-05-22 LAB — OB RESULTS CONSOLE GBS: STREP GROUP B AG: NEGATIVE

## 2015-05-30 ENCOUNTER — Encounter: Payer: Self-pay | Admitting: Anesthesiology

## 2015-05-30 ENCOUNTER — Observation Stay
Admission: RE | Admit: 2015-05-30 | Discharge: 2015-05-30 | Disposition: A | Discharge status: Home / Self Care | Payer: BC Managed Care – PPO | Source: Ambulatory Visit | Attending: Obstetrics and Gynecology | Admitting: Obstetrics and Gynecology

## 2015-05-30 ENCOUNTER — Encounter: Payer: Self-pay | Admitting: *Deleted

## 2015-05-30 DIAGNOSIS — Z3A37 37 weeks gestation of pregnancy: Secondary | ICD-10-CM | POA: Insufficient documentation

## 2015-05-30 NOTE — Anesthesia Preprocedure Evaluation (Deleted)
Anesthesia Evaluation  Patient identified by MRN, date of birth, ID band Patient awake    Reviewed: Allergy & Precautions, NPO status , Patient's Chart, lab work & pertinent test results, reviewed documented beta blocker date and time   Airway Mallampati: II  TM Distance: >3 FB     Dental  (+) Chipped   Pulmonary asthma ,           Cardiovascular      Neuro/Psych    GI/Hepatic   Endo/Other    Renal/GU      Musculoskeletal   Abdominal   Peds  Hematology   Anesthesia Other Findings   Reproductive/Obstetrics                             Anesthesia Physical Anesthesia Plan  ASA: II  Anesthesia Plan: Epidural   Post-op Pain Management:    Induction:   Airway Management Planned:   Additional Equipment:   Intra-op Plan:   Post-operative Plan:   Informed Consent: I have reviewed the patients History and Physical, chart, labs and discussed the procedure including the risks, benefits and alternatives for the proposed anesthesia with the patient or authorized representative who has indicated his/her understanding and acceptance.     Plan Discussed with: CRNA  Anesthesia Plan Comments:         Anesthesia Quick Evaluation

## 2015-05-30 NOTE — Discharge Instructions (Signed)
Fetal Movement Counts °Patient Name: __________________________________________________ Patient Due Date: ____________________ °Performing a fetal movement count is highly recommended in high-risk pregnancies, but it is good for every pregnant woman to do. Your health care provider may ask you to start counting fetal movements at 28 weeks of the pregnancy. Fetal movements often increase: °· After eating a full meal. °· After physical activity. °· After eating or drinking something sweet or cold. °· At rest. °Pay attention to when you feel the baby is most active. This will help you notice a pattern of your baby's sleep and wake cycles and what factors contribute to an increase in fetal movement. It is important to perform a fetal movement count at the same time each day when your baby is normally most active.  °HOW TO COUNT FETAL MOVEMENTS °1. Find a quiet and comfortable area to sit or lie down on your left side. Lying on your left side provides the best blood and oxygen circulation to your baby. °2. Write down the day and time on a sheet of paper or in a journal. °3. Start counting kicks, flutters, swishes, rolls, or jabs in a 2-hour period. You should feel at least 10 movements within 2 hours. °4. If you do not feel 10 movements in 2 hours, wait 2-3 hours and count again. Look for a change in the pattern or not enough counts in 2 hours. °SEEK MEDICAL CARE IF: °· You feel less than 10 counts in 2 hours, tried twice. °· There is no movement in over an hour. °· The pattern is changing or taking longer each day to reach 10 counts in 2 hours. °· You feel the baby is not moving as he or she usually does. °Date: ____________ Movements: ____________ Start time: ____________ Finish time: ____________  °Date: ____________ Movements: ____________ Start time: ____________ Finish time: ____________ °Date: ____________ Movements: ____________ Start time: ____________ Finish time: ____________ °Date: ____________ Movements:  ____________ Start time: ____________ Finish time: ____________ °Date: ____________ Movements: ____________ Start time: ____________ Finish time: ____________ °Date: ____________ Movements: ____________ Start time: ____________ Finish time: ____________ °Date: ____________ Movements: ____________ Start time: ____________ Finish time: ____________ °Date: ____________ Movements: ____________ Start time: ____________ Finish time: ____________  °Date: ____________ Movements: ____________ Start time: ____________ Finish time: ____________ °Date: ____________ Movements: ____________ Start time: ____________ Finish time: ____________ °Date: ____________ Movements: ____________ Start time: ____________ Finish time: ____________ °Date: ____________ Movements: ____________ Start time: ____________ Finish time: ____________ °Date: ____________ Movements: ____________ Start time: ____________ Finish time: ____________ °Date: ____________ Movements: ____________ Start time: ____________ Finish time: ____________ °Date: ____________ Movements: ____________ Start time: ____________ Finish time: ____________  °Date: ____________ Movements: ____________ Start time: ____________ Finish time: ____________ °Date: ____________ Movements: ____________ Start time: ____________ Finish time: ____________ °Date: ____________ Movements: ____________ Start time: ____________ Finish time: ____________ °Date: ____________ Movements: ____________ Start time: ____________ Finish time: ____________ °Date: ____________ Movements: ____________ Start time: ____________ Finish time: ____________ °Date: ____________ Movements: ____________ Start time: ____________ Finish time: ____________ °Date: ____________ Movements: ____________ Start time: ____________ Finish time: ____________  °Date: ____________ Movements: ____________ Start time: ____________ Finish time: ____________ °Date: ____________ Movements: ____________ Start time: ____________ Finish  time: ____________ °Date: ____________ Movements: ____________ Start time: ____________ Finish time: ____________ °Date: ____________ Movements: ____________ Start time: ____________ Finish time: ____________ °Date: ____________ Movements: ____________ Start time: ____________ Finish time: ____________ °Date: ____________ Movements: ____________ Start time: ____________ Finish time: ____________ °Date: ____________ Movements: ____________ Start time: ____________ Finish time: ____________  °Date: ____________ Movements: ____________ Start time: ____________ Finish   time: ____________ Date: ____________ Movements: ____________ Start time: ____________ Doreatha Martin time: ____________ Date: ____________ Movements: ____________ Start time: ____________ Doreatha Martin time: ____________ Date: ____________ Movements: ____________ Start time: ____________ Doreatha Martin time: ____________ Date: ____________ Movements: ____________ Start time: ____________ Doreatha Martin time: ____________ Date: ____________ Movements: ____________ Start time: ____________ Doreatha Martin time: ____________ Date: ____________ Movements: ____________ Start time: ____________ Doreatha Martin time: ____________  Date: ____________ Movements: ____________ Start time: ____________ Doreatha Martin time: ____________ Date: ____________ Movements: ____________ Start time: ____________ Doreatha Martin time: ____________ Date: ____________ Movements: ____________ Start time: ____________ Doreatha Martin time: ____________ Date: ____________ Movements: ____________ Start time: ____________ Doreatha Martin time: ____________ Date: ____________ Movements: ____________ Start time: ____________ Doreatha Martin time: ____________ Date: ____________ Movements: ____________ Start time: ____________ Doreatha Martin time: ____________ Date: ____________ Movements: ____________ Start time: ____________ Doreatha Martin time: ____________  Date: ____________ Movements: ____________ Start time: ____________ Doreatha Martin time: ____________ Date: ____________  Movements: ____________ Start time: ____________ Doreatha Martin time: ____________ Date: ____________ Movements: ____________ Start time: ____________ Doreatha Martin time: ____________ Date: ____________ Movements: ____________ Start time: ____________ Doreatha Martin time: ____________ Date: ____________ Movements: ____________ Start time: ____________ Doreatha Martin time: ____________ Date: ____________ Movements: ____________ Start time: ____________ Doreatha Martin time: ____________ Date: ____________ Movements: ____________ Start time: ____________ Doreatha Martin time: ____________  Date: ____________ Movements: ____________ Start time: ____________ Doreatha Martin time: ____________ Date: ____________ Movements: ____________ Start time: ____________ Doreatha Martin time: ____________ Date: ____________ Movements: ____________ Start time: ____________ Doreatha Martin time: ____________ Date: ____________ Movements: ____________ Start time: ____________ Doreatha Martin time: ____________ Date: ____________ Movements: ____________ Start time: ____________ Doreatha Martin time: ____________ Date: ____________ Movements: ____________ Start time: ____________ Doreatha Martin time: ____________   This information is not intended to replace advice given to you by your health care provider. Make sure you discuss any questions you have with your health care provider.   Document Released: 07/15/2006 Document Revised: 07/06/2014 Document Reviewed: 04/11/2012 Elsevier Interactive Patient Education 2016 [employer].  Follow up with physician for regularly scheduled prenatal visits.

## 2015-05-30 NOTE — OB Triage Note (Signed)
G1P0 pt presents at [redacted]w[redacted]d from [facility] for monitoring for fetal tachycardia and decelerations.

## 2015-05-30 NOTE — Progress Notes (Signed)
Patient ID: Sarah Hutchinson, female   DOB: 01-23-1991, 24 y.o.   MRN: [id number] Delaynie Cossio 1991-03-10 G1 P0 [redacted]w[redacted]d presents for fetal decel in office 1 min ute spontaneously with return . Good fetal movt .  noLOF , no vaginal bleeding , O;BP 111/62 mmHg  Pulse 89  Temp(Src) 97.9 F (36.6 C) (Oral)  SpO2 99% ABDsoft  NT  CX not checked  NST 2 hour prolonged monitoring reactive NST , no additional decels  Labs: none  A: fetal decel isolated with reasurring prolonged fetal monitoring  P:d/c home  Cont daily fetal kick counts

## 2015-05-30 NOTE — Discharge Summary (Signed)
Obstetric Discharge Summary Reason for Admission: spontaneous  fetal decel in office Prenatal Procedures: none Intrapartum Procedures: n/an/a  Postpartum Procedures: n/an/a Complications-Operative and Postpartum: nonen/a HEMOGLOBIN  Date Value Ref Range Status  02/24/2015 10.9* 12.0 - 16.0 g/dL Final   HCT  Date Value Ref Range Status  02/24/2015 32.4* 35.0 - 47.0 % Final    Physical Exam:  General: alert and cooperative Lochia: n/ano LOF , no vag bleeding  Lungs cta  cv rrr Uterine Fundus: gravid  Incision: n/a DVT Evaluation: No evidence of DVT seen on physical exam.  Discharge Diagnoses: spontaneous decel in office , resolved with prolonged monitoring   Discharge Information: Date: 05/30/2015 Activity: unrestricted Diet: routine Medications: None Condition: stable Instructions: refer to practice specific booklet Discharge to: home   Newborn Data: n/a Home with not delivered .  SCHERMERHORN,THOMAS 05/30/2015, 6:56 PM

## 2015-06-04 ENCOUNTER — Inpatient Hospital Stay
Admission: EM | Admit: 2015-06-04 | Discharge: 2015-06-04 | Disposition: A | Discharge status: Home / Self Care | Payer: BC Managed Care – PPO | Attending: Obstetrics and Gynecology | Admitting: Obstetrics and Gynecology

## 2015-06-04 ENCOUNTER — Encounter: Payer: Self-pay | Admitting: *Deleted

## 2015-06-04 DIAGNOSIS — R197 Diarrhea, unspecified: Secondary | ICD-10-CM | POA: Diagnosis present

## 2015-06-04 DIAGNOSIS — O468X3 Other antepartum hemorrhage, third trimester: Secondary | ICD-10-CM | POA: Insufficient documentation

## 2015-06-04 DIAGNOSIS — Z3A37 37 weeks gestation of pregnancy: Secondary | ICD-10-CM | POA: Insufficient documentation

## 2015-06-04 DIAGNOSIS — N939 Abnormal uterine and vaginal bleeding, unspecified: Secondary | ICD-10-CM | POA: Diagnosis present

## 2015-06-04 MED ORDER — LOPERAMIDE HCL 2 MG PO CAPS
2.0000 mg | ORAL_CAPSULE | ORAL | Status: DC | PRN
  Administered 2015-06-04: 2 mg via ORAL
  Filled 2015-06-04: qty 1

## 2015-06-04 MED ORDER — LOPERAMIDE HCL 2 MG PO CAPS
ORAL_CAPSULE | ORAL | Status: AC
  Administered 2015-06-04: 2 mg via ORAL
  Filled 2015-06-04: qty 1

## 2015-06-04 NOTE — Discharge Summary (Signed)
Obstetric Discharge Summary  Reason for Admission: diarrhea , vaginal spotting  Prenatal Procedures: none Intrapartum Procedures: n/a, NST observastion  Postpartum Procedures: none Complications-Operative and Postpartum: none HEMOGLOBIN  Date Value Ref Range Status  02/24/2015 10.9* 12.0 - 16.0 g/dL Final   HCT  Date Value Ref Range Status  02/24/2015 32.4* 35.0 - 47.0 % Final    Physical Exam:  General: alert and cooperative Lochia: n/a Uterine Fundus: firm Incision: n/a DVT Evaluation: No evidence of DVT seen on physical exam.  Discharge Diagnoses: 37+5 week with vaginal spotting , n o labor . Diarrhea   Discharge Information: Date: 06/04/2015 Activity: unrestricted Diet: routine and K+ rich  Medications: imodium 2 mg Condition: stable Instructions: refer to practice specific booklet Discharge to: home Follow-up Information    Follow up with [facility]. Go on 06/05/2015.   Why:  for regular visit   Contact information:   [address] [place] [address] [phone]      Newborn Data: This patient has no babies on file. Home with n/a.  Sarah Hutchinson 06/04/2015, 1:29 PM

## 2015-06-04 NOTE — Progress Notes (Signed)
Patient ID: Sarah Hutchinson, female   DOB: [birth year], 24 y.o.   MRN: [id number] [name] 04-09-1991 G1 P0 [redacted]w[redacted]d presents for vaginal spotting and pelvic cramping .2 days of diarrhea . No preceding abx .  No LOF , no vaginal bleeding , O;BP 125/66 mmHg  Pulse 110  Temp(Src) 98.5 F (36.9 C) (Oral)  Resp 16 ABDsoft NT  + BS  CX closed by RN NST reactive FHR 160 , uterine irritability  Labs:none A: Vaginal spotting , not active . Diarrhea  Reassuring fetal monitoring  P:d/c home with precautions Imodium 2 mg now and repeat in 4 hrs prn

## 2015-06-04 NOTE — OB Triage Note (Signed)
Pt arrived via wheel chair, hooked up to monitor and changed into a gown.

## 2015-06-14 ENCOUNTER — Observation Stay
Admission: EM | Admit: 2015-06-14 | Discharge: 2015-06-14 | Disposition: A | Discharge status: Home / Self Care | Payer: Medicaid Other | Source: Home / Self Care | Admitting: Obstetrics and Gynecology

## 2015-06-14 DIAGNOSIS — Z3A39 39 weeks gestation of pregnancy: Secondary | ICD-10-CM | POA: Insufficient documentation

## 2015-06-14 DIAGNOSIS — Z3490 Encounter for supervision of normal pregnancy, unspecified, unspecified trimester: Secondary | ICD-10-CM

## 2015-06-14 MED ORDER — ONDANSETRON HCL 4 MG PO TABS
ORAL_TABLET | ORAL | Status: AC
  Administered 2015-06-14: 8 mg via ORAL
  Filled 2015-06-14: qty 2

## 2015-06-14 MED ORDER — OXYCODONE-ACETAMINOPHEN 5-325 MG PO TABS
1.0000 | ORAL_TABLET | Freq: Once | ORAL | Status: AC
  Administered 2015-06-14: 1 via ORAL
  Filled 2015-06-14: qty 1

## 2015-06-14 MED ORDER — ONDANSETRON HCL 4 MG PO TABS
8.0000 mg | ORAL_TABLET | Freq: Once | ORAL | Status: AC
  Administered 2015-06-14: 8 mg via ORAL

## 2015-06-14 NOTE — OB Triage Note (Signed)
Pt oriented to LDR 1 and placed on monitors. Pt here with complaints of contractions starting 06/13/2015 at 2300. Membranes were stripped 06/12/2015 at [facility] appointment and cervical exam was a fingertip. Will cont. To monitor.

## 2015-06-14 NOTE — Final Progress Note (Signed)
Physician Final Progress Note  Patient ID: Sarah Hutchinson MRN: [id number] DOB/AGE: November 27, 1990 24 y.o.  Admit date: 06/14/2015 Admitting provider: Suzy Bouchard, MD Discharge date: 06/14/2015   Admission Diagnoses: 39+1 weeks with contractions and abdominal pain   HPI: Sarah Hutchinson is a G1P0 presenting today at 39+1 weeks with contractions that started last night.  She states she was able to sleep through most of them, but woke up at 3:30am with worsening contractions and some nausea.  Fetal monitoring initiated and Zofran and Percocet x 1 given.  Reports good fetal movement.  Denies VB, LOF, dysuria, VCD, HA, Visual disturbances, epigastric pain.   SVE by RN: Dilation: Fingertip Effacement (%): 70 Cervical Position: Middle Exam by:: T. Su Hilt RN   Unchanged over 2 hours   Discharge Diagnoses: 39+1 weeks - false labor/ stable condition  Active Problems:   Pregnancy  Hospital course: NST, Percocet given x1, Zofran given - pt. Was able to rest comfortably and contractions subsided  NST: Baseline: 120bpm / moderate variability/ +accels / no decels - 1 early decel when first on the monitor, but none since  TOCO: irregular contractions   A: IUP at 39+1 weeks Category 1 Fetal tracing  Likely early labor - pt. Well controlled with Percocet  Nausea resolved   Discharge Condition: stable  Disposition: 01-Home or Self Care  Diet: Regular diet  Discharge Activity:  Strict labor precautions and warning s/s reviewed Strict FKC's daily Had Ambien prescription from last week but did not get it filled - recommend getting it filled and advised therapeutic rest / warm tub soaks / ambulate PRN     Medication List    ASK your doctor about these medications        ferrous fumarate 325 (106 FE) MG Tabs tablet  Commonly known as:  HEMOCYTE - 106 mg FE  Take 1 tablet by mouth.     multivitamin-prenatal 27-0.8 MG Tabs tablet  Take 1 tablet by mouth daily at 12 noon.     ORTHO TRI-CYCLEN LO 0.18/0.215/0.25 MG-25 MCG tab  Generic drug:  Norgestimate-Ethinyl Estradiol Triphasic  Take 1 tablet by mouth daily. Reported on 06/14/2015     promethazine 25 MG tablet  Commonly known as:  PHENERGAN  Take 1 tablet (25 mg total) by mouth every 6 (six) hours as needed for nausea.     albuterol (2.5 MG/3ML) 0.083% nebulizer solution  Commonly known as:  PROVENTIL  Take 2.5 mg by nebulization every 6 (six) hours as needed for wheezing or shortness of breath. Reported on 06/14/2015     VENTOLIN HFA IN  Inhale 2 puffs into the lungs daily. Reported on 06/14/2015           Follow-up Information    Follow up with [facility]. Go on 06/19/2015.   Why:  For follow up   Contact information:   [address] [place] [address] [phone]     Keep appointment next week   Signed: Karena Addison 06/14/2015, 11:25 AM

## 2015-06-15 ENCOUNTER — Inpatient Hospital Stay
Admission: EM | Admit: 2015-06-15 | Discharge: 2015-06-15 | Disposition: A | Discharge status: Home / Self Care | Payer: Medicaid Other | Source: Home / Self Care | Admitting: Obstetrics and Gynecology

## 2015-06-15 DIAGNOSIS — Z3A39 39 weeks gestation of pregnancy: Secondary | ICD-10-CM | POA: Insufficient documentation

## 2015-06-15 LAB — CBC WITH DIFFERENTIAL/PLATELET
BASOS PCT: 1 %
Basophils Absolute: 0.1 10*3/uL (ref 0–0.1)
EOS ABS: 0 10*3/uL (ref 0–0.7)
EOS PCT: 0 %
HCT: 32.4 % — ABNORMAL LOW (ref 35.0–47.0)
Hemoglobin: 10.7 g/dL — ABNORMAL LOW (ref 12.0–16.0)
LYMPHS ABS: 1.3 10*3/uL (ref 1.0–3.6)
Lymphocytes Relative: 14 %
MCH: 27.2 pg (ref 26.0–34.0)
MCHC: 32.9 g/dL (ref 32.0–36.0)
MCV: 82.5 fL (ref 80.0–100.0)
MONO ABS: 0.6 10*3/uL (ref 0.2–0.9)
MONOS PCT: 6 %
NEUTROS PCT: 79 %
Neutro Abs: 7.2 10*3/uL — ABNORMAL HIGH (ref 1.4–6.5)
PLATELETS: 176 10*3/uL (ref 150–440)
RBC: 3.93 MIL/uL (ref 3.80–5.20)
RDW: 14.2 % (ref 11.5–14.5)
WBC: 9.1 10*3/uL (ref 3.6–11.0)

## 2015-06-15 LAB — COMPREHENSIVE METABOLIC PANEL
ALBUMIN: 2.7 g/dL — AB (ref 3.5–5.0)
ALT: 12 U/L — ABNORMAL LOW (ref 14–54)
ANION GAP: 7 (ref 5–15)
AST: 14 U/L — ABNORMAL LOW (ref 15–41)
Alkaline Phosphatase: 120 U/L (ref 38–126)
BUN: 9 mg/dL (ref 6–20)
CALCIUM: 8.9 mg/dL (ref 8.9–10.3)
CO2: 25 mmol/L (ref 22–32)
Chloride: 105 mmol/L (ref 101–111)
Creatinine, Ser: 0.64 mg/dL (ref 0.44–1.00)
GFR calc non Af Amer: >60 mL/min (ref >60)
GLUCOSE: 98 mg/dL (ref 65–99)
POTASSIUM: 3.6 mmol/L (ref 3.5–5.1)
SODIUM: 137 mmol/L (ref 135–145)
Total Bilirubin: 0.4 mg/dL (ref 0.3–1.2)
Total Protein: 6.6 g/dL (ref 6.5–8.1)

## 2015-06-15 LAB — URIC ACID: Uric Acid, Serum: 3.7 mg/dL (ref 2.3–6.6)

## 2015-06-15 LAB — PROTEIN / CREATININE RATIO, URINE
CREATININE, URINE: 39 mg/dL
Protein Creatinine Ratio: 0.28 mg/mg{Cre} — ABNORMAL HIGH (ref 0.00–0.15)
TOTAL PROTEIN, URINE: 11 mg/dL

## 2015-06-15 LAB — LACTATE DEHYDROGENASE: LDH: 102 U/L (ref 98–192)

## 2015-06-15 MED ORDER — ONDANSETRON HCL 4 MG PO TABS
ORAL_TABLET | ORAL | Status: AC
  Filled 2015-06-15: qty 1

## 2015-06-15 MED ORDER — ONDANSETRON HCL 4 MG PO TABS
4.0000 mg | ORAL_TABLET | Freq: Once | ORAL | Status: AC
  Administered 2015-06-15: 4 mg via ORAL

## 2015-06-15 MED ORDER — OXYCODONE-ACETAMINOPHEN 5-325 MG PO TABS
1.0000 | ORAL_TABLET | Freq: Once | ORAL | Status: AC
Start: 2015-06-15 — End: 2015-06-15
  Administered 2015-06-15: 1 via ORAL

## 2015-06-15 MED ORDER — OXYCODONE-ACETAMINOPHEN 5-325 MG PO TABS
ORAL_TABLET | ORAL | Status: AC
  Filled 2015-06-15: qty 1

## 2015-06-15 NOTE — Progress Notes (Signed)
EFM adjusted.  Pt's mother states she has vomitted.

## 2015-06-15 NOTE — Discharge Instructions (Signed)
Patient presents with contractions and vaginal pain.  Seen by Roxanne Gates.  EFM with reassuring strip reviewed by provider.  Cervical exam noted with dilation of 1.5 by [facility].  Discharge instructions given verbally and written with acknowledgment of understanding by patient and her mother.  Patient stable and ambulatory. Discharged patient home with her mother.  Hypertension Hypertension is another name for high blood pressure. High blood pressure forces your heart to work harder to pump blood. A blood pressure reading has two numbers, which includes a higher number over a lower number (example: 110/72). HOME CARE   Have your blood pressure rechecked by your doctor.  Only take medicine as told by your doctor. Follow the directions carefully. The medicine does not work as well if you skip doses. Skipping doses also puts you at risk for problems.  Do not smoke.  Monitor your blood pressure at home as told by your doctor. GET HELP IF:  You think you are having a reaction to the medicine you are taking.  You have repeat headaches or feel dizzy.  You have puffiness (swelling) in your ankles.  You have trouble with your vision. GET HELP RIGHT AWAY IF:   You get a very bad headache and are confused.  You feel weak, numb, or faint.  You get chest or belly (abdominal) pain.  You throw up (vomit).  You cannot breathe very well. MAKE SURE YOU:   Understand these instructions.  Will watch your condition.  Will get help right away if you are not doing well or get worse.   This information is not intended to replace advice given to you by your health care provider. Make sure you discuss any questions you have with your health care provider.   Document Released: 12/02/2007 Document Revised: 06/20/2013 Document Reviewed: 04/07/2013 Elsevier Interactive Patient Education 2016 [employer] Fetal Movement Counts Patient Name:  __________________________________________________ Patient Due Date: ____________________ Performing a fetal movement count is highly recommended in high-risk pregnancies, but it is good for every pregnant woman to do. Your health care provider may ask you to start counting fetal movements at 28 weeks of the pregnancy. Fetal movements often increase:  After eating a full meal.  After physical activity.  After eating or drinking something sweet or cold.  At rest. Pay attention to when you feel the baby is most active. This will help you notice a pattern of your baby's sleep and wake cycles and what factors contribute to an increase in fetal movement. It is important to perform a fetal movement count at the same time each day when your baby is normally most active.  HOW TO COUNT FETAL MOVEMENTS  Find a quiet and comfortable area to sit or lie down on your left side. Lying on your left side provides the best blood and oxygen circulation to your baby.  Write down the day and time on a sheet of paper or in a journal.  Start counting kicks, flutters, swishes, rolls, or jabs in a 2-hour period. You should feel at least 10 movements within 2 hours.  If you do not feel 10 movements in 2 hours, wait 2-3 hours and count again. Look for a change in the pattern or not enough counts in 2 hours. SEEK MEDICAL CARE IF:  You feel less than 10 counts in 2 hours, tried twice.  There is no movement in over an hour.  The pattern is changing or taking longer each day to reach 10 counts in 2  hours.  You feel the baby is not moving as he or she usually does. Date: ____________ Movements: ____________ Start time: ____________ Doreatha Martin time: ____________  Date: ____________ Movements: ____________ Start time: ____________ Doreatha Martin time: ____________ Date: ____________ Movements: ____________ Start time: ____________ Doreatha Martin time: ____________ Date: ____________ Movements: ____________ Start time: ____________ Doreatha Martin  time: ____________ Date: ____________ Movements: ____________ Start time: ____________ Doreatha Martin time: ____________ Date: ____________ Movements: ____________ Start time: ____________ Doreatha Martin time: ____________ Date: ____________ Movements: ____________ Start time: ____________ Doreatha Martin time: ____________ Date: ____________ Movements: ____________ Start time: ____________ Doreatha Martin time: ____________  Date: ____________ Movements: ____________ Start time: ____________ Doreatha Martin time: ____________ Date: ____________ Movements: ____________ Start time: ____________ Doreatha Martin time: ____________ Date: ____________ Movements: ____________ Start time: ____________ Doreatha Martin time: ____________ Date: ____________ Movements: ____________ Start time: ____________ Doreatha Martin time: ____________ Date: ____________ Movements: ____________ Start time: ____________ Doreatha Martin time: ____________ Date: ____________ Movements: ____________ Start time: ____________ Doreatha Martin time: ____________ Date: ____________ Movements: ____________ Start time: ____________ Doreatha Martin time: ____________  Date: ____________ Movements: ____________ Start time: ____________ Doreatha Martin time: ____________ Date: ____________ Movements: ____________ Start time: ____________ Doreatha Martin time: ____________ Date: ____________ Movements: ____________ Start time: ____________ Doreatha Martin time: ____________ Date: ____________ Movements: ____________ Start time: ____________ Doreatha Martin time: ____________ Date: ____________ Movements: ____________ Start time: ____________ Doreatha Martin time: ____________ Date: ____________ Movements: ____________ Start time: ____________ Doreatha Martin time: ____________ Date: ____________ Movements: ____________ Start time: ____________ Doreatha Martin time: ____________  Date: ____________ Movements: ____________ Start time: ____________ Doreatha Martin time: ____________ Date: ____________ Movements: ____________ Start time: ____________ Doreatha Martin time: ____________ Date: ____________  Movements: ____________ Start time: ____________ Doreatha Martin time: ____________ Date: ____________ Movements: ____________ Start time: ____________ Doreatha Martin time: ____________ Date: ____________ Movements: ____________ Start time: ____________ Doreatha Martin time: ____________ Date: ____________ Movements: ____________ Start time: ____________ Doreatha Martin time: ____________ Date: ____________ Movements: ____________ Start time: ____________ Doreatha Martin time: ____________  Date: ____________ Movements: ____________ Start time: ____________ Doreatha Martin time: ____________ Date: ____________ Movements: ____________ Start time: ____________ Doreatha Martin time: ____________ Date: ____________ Movements: ____________ Start time: ____________ Doreatha Martin time: ____________ Date: ____________ Movements: ____________ Start time: ____________ Doreatha Martin time: ____________ Date: ____________ Movements: ____________ Start time: ____________ Doreatha Martin time: ____________ Date: ____________ Movements: ____________ Start time: ____________ Doreatha Martin time: ____________ Date: ____________ Movements: ____________ Start time: ____________ Doreatha Martin time: ____________  Date: ____________ Movements: ____________ Start time: ____________ Doreatha Martin time: ____________ Date: ____________ Movements: ____________ Start time: ____________ Doreatha Martin time: ____________ Date: ____________ Movements: ____________ Start time: ____________ Doreatha Martin time: ____________ Date: ____________ Movements: ____________ Start time: ____________ Doreatha Martin time: ____________ Date: ____________ Movements: ____________ Start time: ____________ Doreatha Martin time: ____________ Date: ____________ Movements: ____________ Start time: ____________ Doreatha Martin time: ____________ Date: ____________ Movements: ____________ Start time: ____________ Doreatha Martin time: ____________  Date: ____________ Movements: ____________ Start time: ____________ Doreatha Martin time: ____________ Date: ____________ Movements: ____________ Start time:  ____________ Doreatha Martin time: ____________ Date: ____________ Movements: ____________ Start time: ____________ Doreatha Martin time: ____________ Date: ____________ Movements: ____________ Start time: ____________ Doreatha Martin time: ____________ Date: ____________ Movements: ____________ Start time: ____________ Doreatha Martin time: ____________ Date: ____________ Movements: ____________ Start time: ____________ Doreatha Martin time: ____________ Date: ____________ Movements: ____________ Start time: ____________ Doreatha Martin time: ____________  Date: ____________ Movements: ____________ Start time: ____________ Doreatha Martin time: ____________ Date: ____________ Movements: ____________ Start time: ____________ Doreatha Martin time: ____________ Date: ____________ Movements: ____________ Start time: ____________ Doreatha Martin time: ____________ Date: ____________ Movements: ____________ Start time: ____________ Doreatha Martin time: ____________ Date: ____________ Movements: ____________ Start time: ____________ Doreatha Martin time: ____________ Date: ____________ Movements: ____________ Start time: ____________ Doreatha Martin time: ____________   This information is not intended to replace advice given to you by  your health care provider. Make sure you discuss any questions you have with your health care provider.   Document Released: 07/15/2006 Document Revised: 07/06/2014 Document Reviewed: 04/11/2012 Elsevier Interactive Patient Education [employer]

## 2015-06-15 NOTE — Progress Notes (Signed)
Report given to M.Sigmon, CNM

## 2015-06-15 NOTE — Progress Notes (Signed)
Pt returned to bed after bathroom.  EFM adjusted d/t patient's desire to sit up.   Pt believes she is having UC when in fact on palpation her abdomen is only firm with fetal movement on the right upper side.  Encouraged breathing and massage of her abdomen.

## 2015-06-15 NOTE — OB Triage Note (Signed)
UC became worse around midnight.  Denies ROM, VB.  +FM  Was seen earlier in the day [facility] Fingertip.

## 2015-06-15 NOTE — OB Triage Provider Note (Signed)
History     CSN: [id number]  Arrival date and time: 06/15/15 0216   None     Chief Complaint  Patient presents with  . Laboring   HPI Sarah Hutchinson is a 24 year old G1P0 at 39+2 weeks today by LMP of 09/13/14 with an EDD of 06/20/15 resenting with painful contractions and rule out labor. She was seen early yesterday morning and discharged home with therapeutic rest.  She state she feels more lower back pain and sharp vaginal pains.  She reports good fetal movement.  She denies vaginal bleeding, LOF, abnormal discharge, and dysuria.  She denies HA, but does report "floaters and dark spots" and denies epigastric pain.  She has vomited 3 times since arrival.   OB History    Gravida Para Term Preterm AB TAB SAB Ectopic Multiple Living   1 0 0 0 0 0 0 0        Past Medical History  Diagnosis Date  . Asthma   . Asthma     Past Surgical History  Procedure Laterality Date  . Foot surgery Right     Family History  Problem Relation Age of Onset  . Diabetes Mother   . Hypertension Mother     Social History  Substance Use Topics  . Smoking status: Never Smoker   . Smokeless tobacco: None  . Alcohol Use: No    Allergies: No Known Allergies  Prescriptions prior to admission  Medication Sig Dispense Refill Last Dose  . albuterol (PROVENTIL) (2.5 MG/3ML) 0.083% nebulizer solution Take 2.5 mg by nebulization every 6 (six) hours as needed for wheezing or shortness of breath. Reported on 06/14/2015   Not Taking at Unknown time  . Albuterol Sulfate (VENTOLIN HFA IN) Inhale 2 puffs into the lungs daily. Reported on 06/14/2015   Not Taking at Unknown time  . ferrous fumarate (HEMOCYTE - 106 MG FE) 325 (106 FE) MG TABS tablet Take 1 tablet by mouth.   06/13/2015 at Unknown time  . Norgestimate-Ethinyl Estradiol Triphasic (ORTHO TRI-CYCLEN LO) 0.18/0.215/0.25 MG-25 MCG tablet Take 1 tablet by mouth daily. Reported on 06/14/2015   Not Taking at Unknown time  . Prenatal Vit-Fe  Fumarate-FA (MULTIVITAMIN-PRENATAL) 27-0.8 MG TABS tablet Take 1 tablet by mouth daily at 12 noon.   06/13/2015 at Unknown time  . promethazine (PHENERGAN) 25 MG tablet Take 1 tablet (25 mg total) by mouth every 6 (six) hours as needed for nausea. 20 tablet 0     Review of Systems  Constitutional: Negative for fever and chills.  Eyes:       +floaters and dark spots  Respiratory: Negative for cough, shortness of breath and wheezing.   Cardiovascular: Negative for chest pain and palpitations.  Gastrointestinal: Positive for nausea and vomiting.  Genitourinary: Negative for dysuria, urgency and frequency.       +painful irregular uterine contractions  Musculoskeletal: Positive for back pain.  Skin: Negative.   Neurological: Positive for dizziness. Negative for headaches.  Endo/Heme/Allergies: Negative.    Physical Exam   Blood pressure 143/80, pulse 96, temperature 98.1 F (36.7 C), temperature source Oral, resp. rate 18, height  (1.702 m), weight 100.699 kg (222 lb).  Physical Exam  Constitutional: She is oriented to person, place, and time. She appears well-developed and well-nourished. She appears distressed.  GI: Soft. There is no tenderness.  Genitourinary:  Uterus: gravid, non-tender, mild irregular contractions palpated  Musculoskeletal: She exhibits edema.  + 1 depended LE edema  Neurological: She is alert  and oriented to person, place, and time.  Skin: Skin is warm and dry.  Dilation: Fingertip Effacement (%): Thick Station: 0 Exam by:: CGD   Course  Procedures   Assessment and Plan  IUP at 39+3 weeks Level of observation Fetal monitoring Elevated BP with pedal edema and visual disturbances - CBC, CMP, Uric Acid, LHD, protein/creatinine ratio to r/o preclampsia Percocet x 1  Zofran 4mg  x 1 Monitor BP q 30 min Re-evaluate in 2 hours  Karena Addison 06/15/2015, 3:38 AM   Reassessment at 0800: S: Pt. Reports feeling better, but still having painful  vaginal pains  Nausea has improved some- but mainly has it when she feels pain Floaters and dark spots have improved - she states mainly when she is throwing up  O: BP's WNL  Fetal tracing:  Baseline: 130 bpm / Moderate variability /+accels / no decels TOCO: irregular contractions   A: IUP at 39+2 weeks  Category 1 fetal tracing  P: D/C home Strict Labor precautions and warning s/s reviewed Strict FKC's daily Pre-eclampsia s/s reviewed  Call with worsening s/s Recommend tub soaks, ambulation, and Tylenol PRN / increase hydration  Keep appointment on 12/21 at [facility]  Dr. Feliberto Gottron aware and agrees with plan  Carlean Jews, CNM

## 2015-06-15 NOTE — Progress Notes (Signed)
Strip reviewed by provider.

## 2015-06-16 ENCOUNTER — Inpatient Hospital Stay
Admission: RE | Admit: 2015-06-16 | Discharge: 2015-06-17 | DRG: 775 | Disposition: A | Discharge status: Home / Self Care | Payer: Medicaid Other | Attending: Obstetrics and Gynecology | Admitting: Obstetrics and Gynecology

## 2015-06-16 ENCOUNTER — Inpatient Hospital Stay: Payer: Medicaid Other | Admitting: Anesthesiology

## 2015-06-16 DIAGNOSIS — O98819 Other maternal infectious and parasitic diseases complicating pregnancy, unspecified trimester: Principal | ICD-10-CM | POA: Diagnosis present

## 2015-06-16 DIAGNOSIS — S3141XA Laceration without foreign body of vagina and vulva, initial encounter: Secondary | ICD-10-CM | POA: Diagnosis present

## 2015-06-16 DIAGNOSIS — O9952 Diseases of the respiratory system complicating childbirth: Secondary | ICD-10-CM | POA: Diagnosis present

## 2015-06-16 DIAGNOSIS — IMO0001 Reserved for inherently not codable concepts without codable children: Secondary | ICD-10-CM

## 2015-06-16 DIAGNOSIS — J45909 Unspecified asthma, uncomplicated: Secondary | ICD-10-CM | POA: Diagnosis present

## 2015-06-16 DIAGNOSIS — Z3A39 39 weeks gestation of pregnancy: Secondary | ICD-10-CM | POA: Diagnosis not present

## 2015-06-16 LAB — CBC
HCT: 35.3 % (ref 35.0–47.0)
HEMOGLOBIN: 11.3 g/dL — AB (ref 12.0–16.0)
MCH: 26.7 pg (ref 26.0–34.0)
MCHC: 32.1 g/dL (ref 32.0–36.0)
MCV: 83.1 fL (ref 80.0–100.0)
Platelets: 216 10*3/uL (ref 150–440)
RBC: 4.24 MIL/uL (ref 3.80–5.20)
RDW: 14.4 % (ref 11.5–14.5)
WBC: 11.4 10*3/uL — AB (ref 3.6–11.0)

## 2015-06-16 LAB — ABO/RH: ABO/RH(D): A POS

## 2015-06-16 LAB — URINALYSIS COMPLETE WITH MICROSCOPIC (ARMC ONLY)
BILIRUBIN URINE: NEGATIVE
GLUCOSE, UA: NEGATIVE mg/dL
Hgb urine dipstick: NEGATIVE
Leukocytes, UA: NEGATIVE
Nitrite: NEGATIVE
Protein, ur: 100 mg/dL — AB
SPECIFIC GRAVITY, URINE: 1.025 (ref 1.005–1.030)
pH: 5 (ref 5.0–8.0)

## 2015-06-16 LAB — TYPE AND SCREEN
ABO/RH(D): A POS
ANTIBODY SCREEN: NEGATIVE

## 2015-06-16 LAB — OB RESULTS CONSOLE GC/CHLAMYDIA
CHLAMYDIA, DNA PROBE: NEGATIVE
GC PROBE AMP, GENITAL: NEGATIVE

## 2015-06-16 LAB — CHLAMYDIA/NGC RT PCR (ARMC ONLY)
Chlamydia Tr: NOT DETECTED
N GONORRHOEAE: NOT DETECTED

## 2015-06-16 MED ORDER — OXYCODONE-ACETAMINOPHEN 5-325 MG PO TABS
1.0000 | ORAL_TABLET | ORAL | Status: DC | PRN
  Administered 2015-06-16: 1 via ORAL
  Filled 2015-06-16: qty 1

## 2015-06-16 MED ORDER — ONDANSETRON HCL 4 MG PO TABS: 4.0000 mg | ORAL_TABLET | ORAL | Status: DC | PRN

## 2015-06-16 MED ORDER — CITRIC ACID-SODIUM CITRATE 334-500 MG/5ML PO SOLN: 30.0000 mL | ORAL | Status: DC | PRN

## 2015-06-16 MED ORDER — OXYTOCIN 40 UNITS IN LACTATED RINGERS INFUSION - SIMPLE MED
62.5000 mL/h | INTRAVENOUS | Status: DC
  Filled 2015-06-16: qty 1000

## 2015-06-16 MED ORDER — DIPHENHYDRAMINE HCL 25 MG PO CAPS: 25.0000 mg | ORAL_CAPSULE | Freq: Four times a day (QID) | ORAL | Status: DC | PRN

## 2015-06-16 MED ORDER — FENTANYL 2.5 MCG/ML W/ROPIVACAINE 0.2% IN NS 100 ML EPIDURAL INFUSION (ARMC-ANES)
10.0000 mL/h | EPIDURAL | Status: DC
  Administered 2015-06-16: 10 mL/h via EPIDURAL

## 2015-06-16 MED ORDER — IBUPROFEN 600 MG PO TABS
600.0000 mg | ORAL_TABLET | Freq: Four times a day (QID) | ORAL | Status: DC
  Administered 2015-06-16 – 2015-06-17 (×4): 600 mg via ORAL
  Filled 2015-06-16 (×4): qty 1

## 2015-06-16 MED ORDER — MISOPROSTOL 200 MCG PO TABS
ORAL_TABLET | ORAL | Status: AC
  Filled 2015-06-16: qty 4

## 2015-06-16 MED ORDER — BUPIVACAINE HCL (PF) 0.25 % IJ SOLN
INTRAMUSCULAR | Status: DC | PRN
  Administered 2015-06-16: 8 mL via EPIDURAL

## 2015-06-16 MED ORDER — DIPHENHYDRAMINE HCL 50 MG/ML IJ SOLN: 12.5000 mg | INTRAMUSCULAR | Status: DC | PRN

## 2015-06-16 MED ORDER — WITCH HAZEL-GLYCERIN EX PADS
1.0000 | MEDICATED_PAD | CUTANEOUS | Status: DC | PRN
Start: 2015-06-16 — End: 2015-06-17

## 2015-06-16 MED ORDER — OXYTOCIN BOLUS FROM INFUSION
500.0000 mL | INTRAVENOUS | Status: DC
  Administered 2015-06-16: 500 mL via INTRAVENOUS

## 2015-06-16 MED ORDER — FENTANYL 2.5 MCG/ML W/ROPIVACAINE 0.2% IN NS 100 ML EPIDURAL INFUSION (ARMC-ANES)
EPIDURAL | Status: AC
  Filled 2015-06-16: qty 100

## 2015-06-16 MED ORDER — LIDOCAINE-EPINEPHRINE (PF) 1.5 %-1:200000 IJ SOLN
INTRAMUSCULAR | Status: DC | PRN
  Administered 2015-06-16 (×2): 3 mL via PERINEURAL

## 2015-06-16 MED ORDER — LANOLIN HYDROUS EX OINT: TOPICAL_OINTMENT | CUTANEOUS | Status: DC | PRN

## 2015-06-16 MED ORDER — EPHEDRINE 5 MG/ML INJ
10.0000 mg | INTRAVENOUS | Status: DC | PRN
  Filled 2015-06-16: qty 2

## 2015-06-16 MED ORDER — BENZOCAINE-MENTHOL 20-0.5 % EX AERO
1.0000 "application " | INHALATION_SPRAY | CUTANEOUS | Status: DC | PRN
  Administered 2015-06-16: 1 via TOPICAL
  Filled 2015-06-16: qty 56

## 2015-06-16 MED ORDER — ZOLPIDEM TARTRATE 5 MG PO TABS: 5.0000 mg | ORAL_TABLET | Freq: Every evening | ORAL | Status: DC | PRN

## 2015-06-16 MED ORDER — ACETAMINOPHEN 325 MG PO TABS: 650.0000 mg | ORAL_TABLET | ORAL | Status: DC | PRN

## 2015-06-16 MED ORDER — LACTATED RINGERS IV SOLN
500.0000 mL | INTRAVENOUS | Status: DC | PRN
  Administered 2015-06-16: 1000 mL via INTRAVENOUS

## 2015-06-16 MED ORDER — ONDANSETRON HCL 4 MG/2ML IJ SOLN: 4.0000 mg | INTRAMUSCULAR | Status: DC | PRN

## 2015-06-16 MED ORDER — BUTORPHANOL TARTRATE 1 MG/ML IJ SOLN: 1.0000 mg | INTRAMUSCULAR | Status: DC | PRN

## 2015-06-16 MED ORDER — OXYCODONE-ACETAMINOPHEN 5-325 MG PO TABS: 2.0000 | ORAL_TABLET | ORAL | Status: DC | PRN

## 2015-06-16 MED ORDER — SENNOSIDES-DOCUSATE SODIUM 8.6-50 MG PO TABS
2.0000 | ORAL_TABLET | ORAL | Status: DC
  Administered 2015-06-16: 2 via ORAL
  Filled 2015-06-16: qty 2

## 2015-06-16 MED ORDER — SIMETHICONE 80 MG PO CHEW: 80.0000 mg | CHEWABLE_TABLET | ORAL | Status: DC | PRN

## 2015-06-16 MED ORDER — PHENYLEPHRINE 40 MCG/ML (10ML) SYRINGE FOR IV PUSH (FOR BLOOD PRESSURE SUPPORT)
80.0000 ug | PREFILLED_SYRINGE | INTRAVENOUS | Status: DC | PRN
  Filled 2015-06-16: qty 2

## 2015-06-16 MED ORDER — LACTATED RINGERS IV SOLN
INTRAVENOUS | Status: DC
  Administered 2015-06-16 (×2): 125 mL/h via INTRAVENOUS

## 2015-06-16 MED ORDER — OXYTOCIN 40 UNITS IN LACTATED RINGERS INFUSION - SIMPLE MED
1.0000 m[IU]/min | INTRAVENOUS | Status: DC
  Administered 2015-06-16: 1 m[IU]/min via INTRAVENOUS

## 2015-06-16 MED ORDER — AMMONIA AROMATIC IN INHA
RESPIRATORY_TRACT | Status: AC
  Filled 2015-06-16: qty 10

## 2015-06-16 MED ORDER — LIDOCAINE HCL (PF) 1 % IJ SOLN
30.0000 mL | INTRAMUSCULAR | Status: DC | PRN
  Filled 2015-06-16: qty 30

## 2015-06-16 MED ORDER — TERBUTALINE SULFATE 1 MG/ML IJ SOLN: 0.2500 mg | Freq: Once | INTRAMUSCULAR | Status: DC | PRN

## 2015-06-16 MED ORDER — OXYTOCIN 10 UNIT/ML IJ SOLN
INTRAMUSCULAR | Status: AC
  Filled 2015-06-16: qty 2

## 2015-06-16 MED ORDER — ONDANSETRON HCL 4 MG/2ML IJ SOLN
4.0000 mg | Freq: Four times a day (QID) | INTRAMUSCULAR | Status: DC | PRN
  Administered 2015-06-16 (×2): 4 mg via INTRAVENOUS
  Filled 2015-06-16 (×2): qty 2

## 2015-06-16 MED ORDER — PRENATAL MULTIVITAMIN CH
1.0000 | ORAL_TABLET | Freq: Every day | ORAL | Status: DC
  Administered 2015-06-16 – 2015-06-17 (×2): 1 via ORAL
  Filled 2015-06-16 (×2): qty 1

## 2015-06-16 MED ORDER — DIBUCAINE 1 % RE OINT
1.0000 | TOPICAL_OINTMENT | RECTAL | Status: DC | PRN
Start: 2015-06-16 — End: 2015-06-17

## 2015-06-16 NOTE — Progress Notes (Signed)
S:  Pt. Is feeling more "vaginal pressure"      Discussed risks/benefits of AROM - pt. Agrees to AROM  O:  VS: Blood pressure 129/71, pulse 91, temperature 98.3 F (36.8 C), temperature source Oral, resp. rate 14, height 5\' 7"  (1.702 m), weight 100.699 kg (222 lb), last menstrual period 09/13/2014, SpO2 100 %, unknown if currently breastfeeding.        FHR : baseline 125 bpm / variability Moderate / accelerations + / no decelerations        Toco: contractions every 3-5 minutes / Moderate to palpation        Cervix : 7cm/100%/0/vtx        Membranes: AROM at 0850 clear fluid   A: Active labor     FHR category 1  P: May restart Pitocin if slow progression      Reassess in 1-2 hours     Anticipate NSVD  Carlean Jews, CNM

## 2015-06-16 NOTE — Anesthesia Procedure Notes (Signed)
Epidural Patient location during procedure: OB Start time: 06/16/2015 4:20 AM End time: 06/16/2015 4:28 AM  Staffing Anesthesiologist: Yves Dill Performed by: anesthesiologist   Preanesthetic Checklist Completed: patient identified, site marked, surgical consent, pre-op evaluation, timeout performed, IV checked, risks and benefits discussed and monitors and equipment checked  Epidural Patient position: sitting Prep: Betadine and site prepped and draped Patient monitoring: heart rate, cardiac monitor, continuous pulse ox and blood pressure Approach: midline Location: L3-L4 Injection technique: LOR air  Needle:  Needle type: Tuohy  Needle gauge: 18 G Needle length: 9 cm Needle insertion depth: 6 cm Catheter type: closed end Catheter size: 20 Guage Test dose: negative and 1.5% lidocaine with Epi 1:200 K  Assessment Sensory level: T8  Additional Notes Time out calleed.  Patient placed in sitting position and prepped and draped in sterile fashion.  A skin wheal was made in the L3-L4 interspace.  Epidural was placed easily with a negative TD.  Patient tolerated the procedure well.

## 2015-06-16 NOTE — Progress Notes (Signed)
S:  "Feeling more pressure in my bottom"       Anesthesia re-dosed epidural  O:  VS: Blood pressure 126/63, pulse 76, temperature 98.2 F (36.8 C), temperature source Oral, resp. rate 16, height 5\' 7"  (1.702 m), weight 100.699 kg (222 lb), last menstrual period 09/13/2014, SpO2 100 %, unknown if currently breastfeeding.        FHR : baseline 125 bpm/ variability Moderate / accelerations + / 1 prolonged deceleration with good return to baseline after repositioning / no further decelerations         Toco: contractions every 2-3 minutes / moderate to strong        Cervix : Dilation: 8 Effacement (%): 100 Cervical Position: Anterior Station: +1 Presentation: Vertex Exam by:: SJG        Membranes: AROM - clear fluid  A: Active labor     FHR category 2  P: Continue expectant management      Reassess in 1-2 hours     Anticipate NSVD  Carlean Jews, CNM

## 2015-06-16 NOTE — Anesthesia Preprocedure Evaluation (Signed)
Anesthesia Evaluation  Patient identified by MRN, date of birth, ID band Patient awake    Reviewed: Allergy & Precautions, NPO status , Patient's Chart, lab work & pertinent test results  Airway Mallampati: II  TM Distance: >3 FB Neck ROM: Full    Dental no notable dental hx.    Pulmonary asthma ,    Pulmonary exam normal breath sounds clear to auscultation       Cardiovascular negative cardio ROS Normal cardiovascular exam     Neuro/Psych negative neurological ROS  negative psych ROS   GI/Hepatic negative GI ROS, Neg liver ROS,   Endo/Other  negative endocrine ROS  Renal/GU negative Renal ROS  negative genitourinary   Musculoskeletal negative musculoskeletal ROS (+)   Abdominal Normal abdominal exam  (+)   Peds negative pediatric ROS (+)  Hematology negative hematology ROS (+)   Anesthesia Other Findings   Reproductive/Obstetrics (+) Pregnancy                             Anesthesia Physical Anesthesia Plan  ASA: II  Anesthesia Plan: Epidural   Post-op Pain Management:    Induction:   Airway Management Planned: Natural Airway  Additional Equipment:   Intra-op Plan:   Post-operative Plan:   Informed Consent:   Plan Discussed with: Surgeon  Anesthesia Plan Comments:         Anesthesia Quick Evaluation

## 2015-06-16 NOTE — Progress Notes (Signed)
Cath placed

## 2015-06-16 NOTE — Progress Notes (Signed)
S:  Pt. Is more comfortable with epidural - having some pain on the left side / repositioned on left side with relief       Having more "vaginal pressure"  O:  VS: Blood pressure 129/71, pulse 91, temperature 98.3 F (36.8 C), temperature source Oral, resp. rate 14, height 5\' 7"  (1.702 m), weight 100.699 kg (222 lb), last menstrual period 09/13/2014, SpO2 99 %, unknown if currently breastfeeding.        FHR : baseline 140 bpm / variability Moderate / accelerations + / questionable prolonged decelerations vs maternal HR - recovered well / otherwise no deceleration         Toco: contractions every 2-58minutes / Moderate to palpation        Cervix :         Membranes: intact  A: Latent labor     FHR category   P: Pitocin on for 30 minutes and questionable fetal deceleration vs. Maternal hr - pitocin stopped and reassess      Restart Pitocin in 30 minutes if FHR strip Category 1      Reassess in 1-2 hours     Anticipate NSVD      Carlean Jews, CNM

## 2015-06-16 NOTE — Progress Notes (Signed)
Test Dose  3cc 1.5% Lido with Epi

## 2015-06-16 NOTE — Progress Notes (Signed)
Test dose #2 47ml 1.5% Lido with epi

## 2015-06-16 NOTE — H&P (Signed)
  OB ADMISSION/ HISTORY & PHYSICAL:  Admission Date: 06/16/2015  1:59 AM  Admit Diagnosis: 39+[redacted] weeks gestation indication for labor and delivery  Sarah Hutchinson is a 24 y.o. female G1P0 at 39+2 weeks presenting for active labor at term.    Prenatal History: G1P0000   EDC : 06/20/2015, by Last Menstrual Period 09/13/14 Prenatal care at [facility]  Prenatal course complicated by: +chlamydia 11/05/14 with TOC neg 12/06/14, she is not together with the FOB and he has not contacted her recently to be involved in care, but she has good support from her mother, obesity   Prenatal Labs: ABO, Rh: A/Positive/-- (05/19 0000) Antibody: Negative (05/19 0000) Rubella: Immune (05/19 0000)  RPR: Nonreactive (05/19 0000)  HBsAg: Negative (05/19 0000)  HIV: Non-reactive (05/19 0000)  GBS: Negative (11/23 0000)   Medical / Surgical History :  Past medical history:  Past Medical History  Diagnosis Date  . Asthma   . Asthma      Past surgical history:  Past Surgical History  Procedure Laterality Date  . Foot surgery Right     Family History:  Family History  Problem Relation Age of Onset  . Diabetes Mother   . Hypertension Mother      Social History:  reports that she has never smoked. She does not have any smokeless tobacco history on file. She reports that she does not drink alcohol or use illicit drugs.   Allergies: Review of patient's allergies indicates no known allergies.    Current Medications at time of admission:  Prior to Admission medications   Medication Sig Start Date End Date Taking? Authorizing Provider  albuterol (PROVENTIL) (2.5 MG/3ML) 0.083% nebulizer solution Take 2.5 mg by nebulization every 6 (six) hours as needed for wheezing or shortness of breath. Reported on 06/14/2015    Historical Provider, MD  Albuterol Sulfate (VENTOLIN HFA IN) Inhale 2 puffs into the lungs daily. Reported on 06/14/2015    Historical Provider, MD  ferrous fumarate (HEMOCYTE - 106 MG  FE) 325 (106 FE) MG TABS tablet Take 1 tablet by mouth.    Historical Provider, MD  Prenatal Vit-Fe Fumarate-FA (MULTIVITAMIN-PRENATAL) 27-0.8 MG TABS tablet Take 1 tablet by mouth daily at 12 noon.    Historical Provider, MD  promethazine (PHENERGAN) 25 MG tablet Take 1 tablet (25 mg total) by mouth every 6 (six) hours as needed for nausea. 12/20/11 12/27/11  Dayton Bailiff, MD     Review of Systems: Active FM onset of ctx @ midnight currently every 10 minutes -reports increase in intensity No LOF bloody show + +nausea, vomiting    Physical Exam:  VS: Last menstrual period 09/13/2014.  General: alert and oriented, appears anxious, and in pain  Heart: RRR Lungs: Clear lung fields Abdomen: Gravid, soft and non-tender, non-distended / uterus: gravid, non-tender / mild to moderate contractions palpated/ [place] EFW: 7#15, vtx.  Extremities: non-pitting LE edema bilaterally  Genitalia / VE: Dilation: 3 Effacement (%): 70 Station: 0 Exam by:: CGD  FHR: baseline rate 135 bpm / variability Moderate / accelerations + / occasional early decelerations TOCO: every 5-10 minutes   Assessment: 39+[redacted] weeks gestation Latent stage of labor FHR category 2 GBS Negative    Plan:  Admit to Birth Place Routine L&D orders May have epidural upon request  Repeat GC/CT today with hx  Anticipate NSVD  Dr. Ventura Bruns notified of admission / plan of care  Carlean Jews, CNM

## 2015-06-16 NOTE — Progress Notes (Signed)
Bolus 8cc .25% Marcaine

## 2015-06-17 LAB — CBC
HEMATOCRIT: 28 % — AB (ref 35.0–47.0)
HEMOGLOBIN: 8.9 g/dL — AB (ref 12.0–16.0)
MCH: 26.2 pg (ref 26.0–34.0)
MCHC: 31.6 g/dL — ABNORMAL LOW (ref 32.0–36.0)
MCV: 82.8 fL (ref 80.0–100.0)
Platelets: 156 10*3/uL (ref 150–440)
RBC: 3.38 MIL/uL — AB (ref 3.80–5.20)
RDW: 14.5 % (ref 11.5–14.5)
WBC: 9.4 10*3/uL (ref 3.6–11.0)

## 2015-06-17 LAB — RPR: RPR: NONREACTIVE

## 2015-06-17 MED ORDER — IBUPROFEN 600 MG PO TABS: 600.0000 mg | ORAL_TABLET | Freq: Four times a day (QID) | ORAL | Status: DC

## 2015-06-17 MED ORDER — NORETHINDRONE 0.35 MG PO TABS: 1.0000 | ORAL_TABLET | Freq: Every day | ORAL | Status: DC

## 2015-06-17 NOTE — Progress Notes (Signed)
Pt discharged home with infant.  Discharge instructions and follow up appointment given to and reviewed with pt.  Pt verbalized understanding.  Escorted by auxillary. 

## 2015-06-17 NOTE — Anesthesia Postprocedure Evaluation (Signed)
Anesthesia Post Note  Patient: Sarah Hutchinson  Procedure(s) Performed: * No procedures listed *  Patient location during evaluation: Mother Baby Anesthesia Type: Epidural Level of consciousness: awake, awake and alert and oriented Pain management: pain level controlled Vital Signs Assessment: post-procedure vital signs reviewed and stable Respiratory status: spontaneous breathing and nonlabored ventilation Cardiovascular status: stable Postop Assessment: no headache, no backache and patient able to bend at knees Anesthetic complications: no    Last Vitals:  Filed Vitals:   06/17/15 0328 06/17/15 0753  BP: 106/49 105/51  Pulse: 70 65  Temp: 36.7 C 36.7 C  Resp: 18 20    Last Pain:  Filed Vitals:   06/17/15 0753  PainSc: 0-No pain                 Ginger Carne

## 2015-06-17 NOTE — Discharge Summary (Signed)
Obstetric Discharge Summary Reason for Admission: onset of labor Prenatal Procedures: none Intrapartum Procedures: spontaneous vaginal delivery Postpartum Procedures: none Complications-Operative and Postpartum: none HEMOGLOBIN  Date Value Ref Range Status  06/17/2015 8.9* 12.0 - 16.0 g/dL Final    Comment:    RESULT REPEATED AND VERIFIED   HCT  Date Value Ref Range Status  06/17/2015 28.0* 35.0 - 47.0 % Final    Physical Exam:  General: alert and no distress Lochia: appropriate Uterine Fundus: firm, non-tender Incision: n/a DVT Evaluation: No evidence of DVT seen on physical exam.  Discharge Diagnoses: Term Pregnancy-delivered  Discharge Information: Date: 06/17/2015 Activity: unrestricted and pelvic rest Diet: routine Medications: see med rec, continue iron sulfate twice daily Condition: stable Instructions: refer to practice specific booklet Discharge to: home Follow-up Information    Follow up with Sarah Hutchinson, CNM In 6 weeks.   Specialty:  Certified Nurse Midwife   Why:  Postpartum and repeat Pap   Contact information:   [address] [name] [address] [phone]       Newborn Data: Live born female  Birth Weight: 7 lb 0.5 oz (3190 g) APGAR: 8, 9  Home with mother.  Sarah Hutchinson 06/17/2015, 9:54 AM

## 2015-10-10 IMAGING — US US OB LIMITED
1 series · 14 of 28 positions shown · non-contrast
Comparison: none

CLINICAL DATA: Twenty-three weeks 3 days pregnant with lower
abdominal pain for 3-4 days.

EXAM:
LIMITED OBSTETRIC ULTRASOUND AND TRANSVAGINAL OBSTETRIC ULTRASOUND

[Series 1: us ob limited · 0.25mm/px · 14 of 85 slices shown]
[im 4/85]
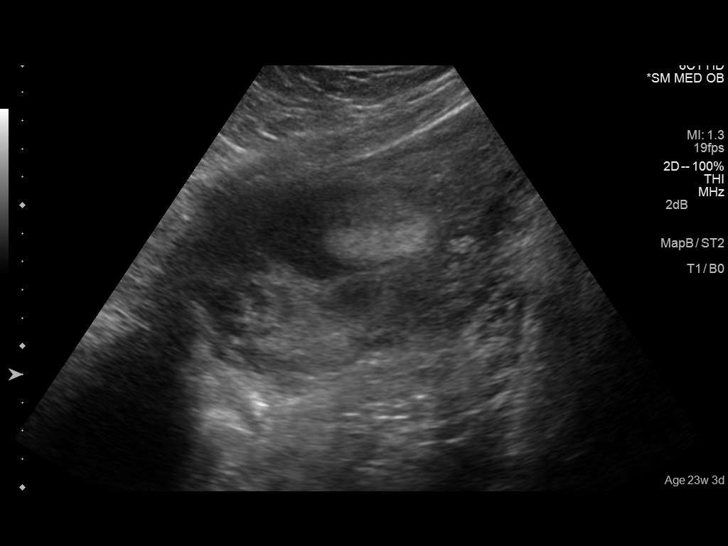
[im 10/85]
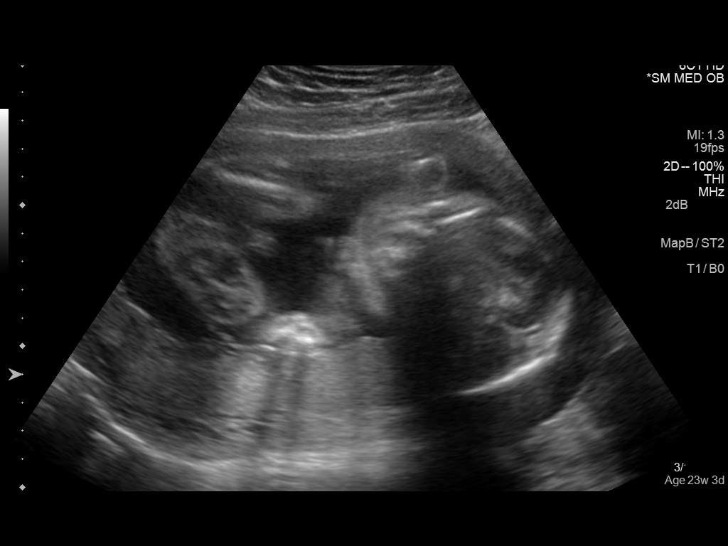
[im 16/85]
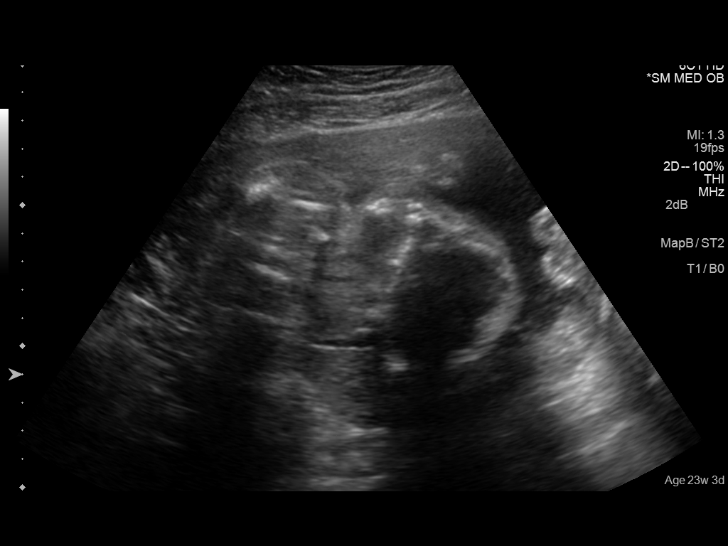
[im 22/85]
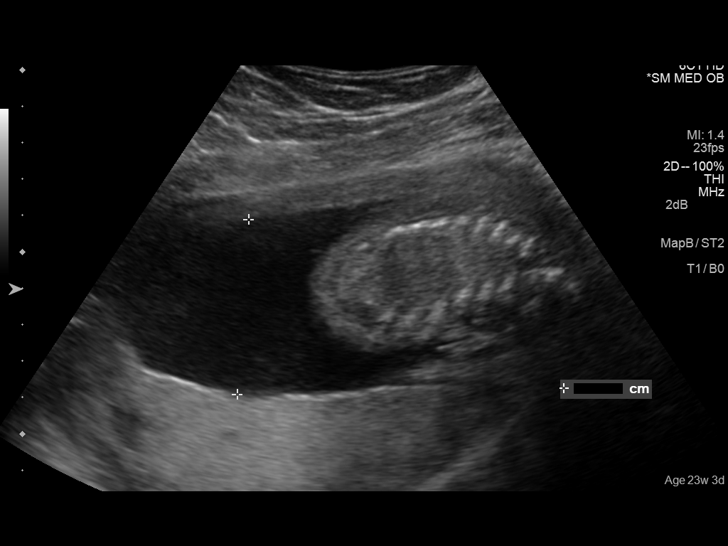
[im 29/85]
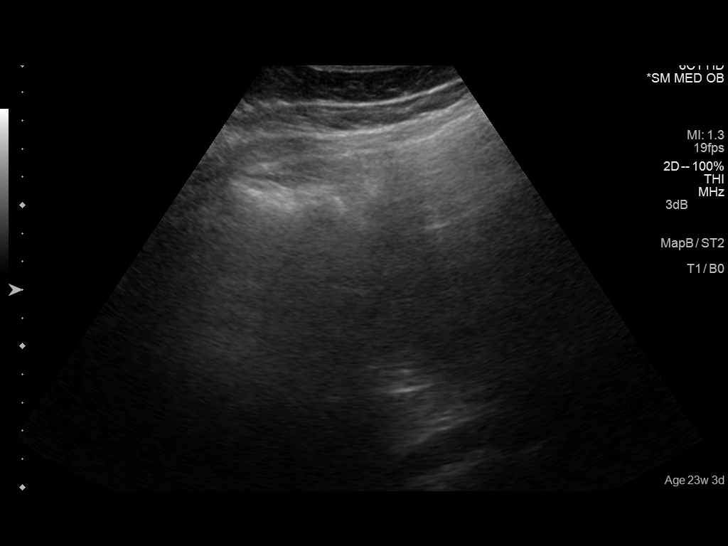
[im 35/85]
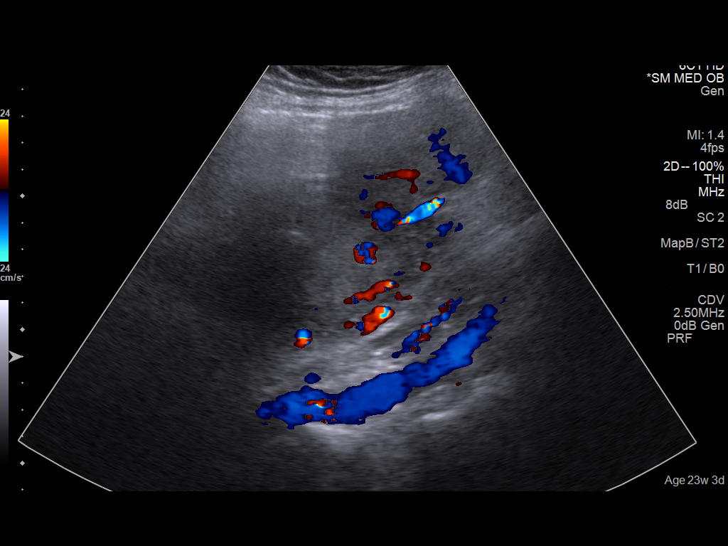
[im 41/85]
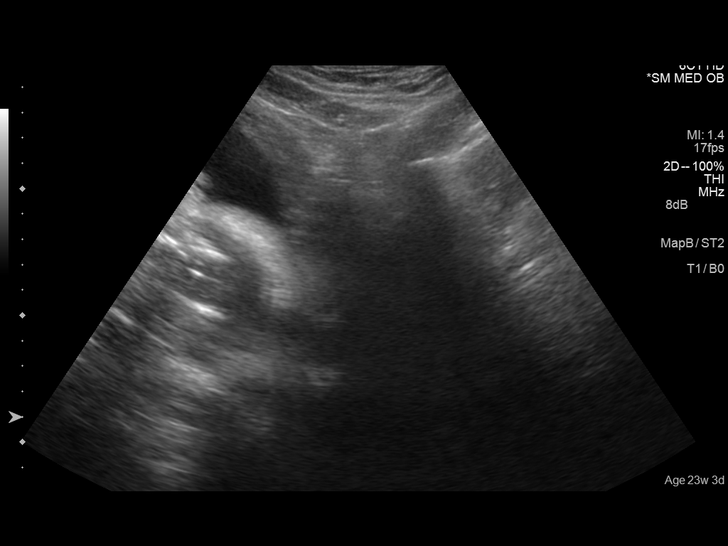
[im 47/85]
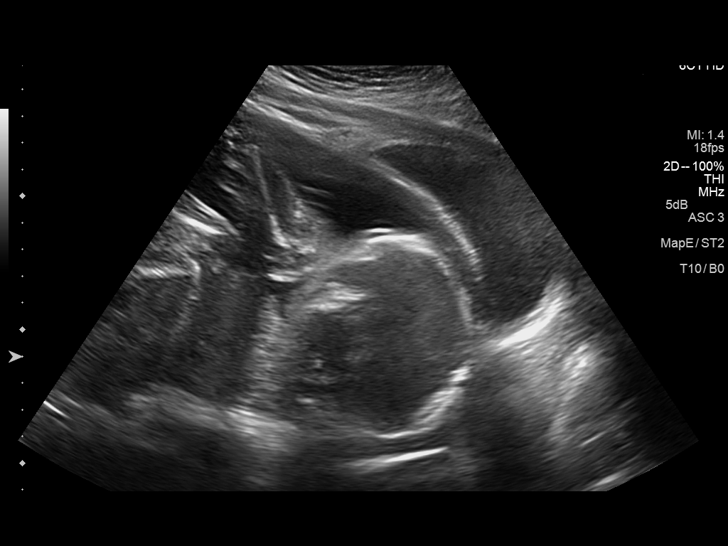
[im 53/85]
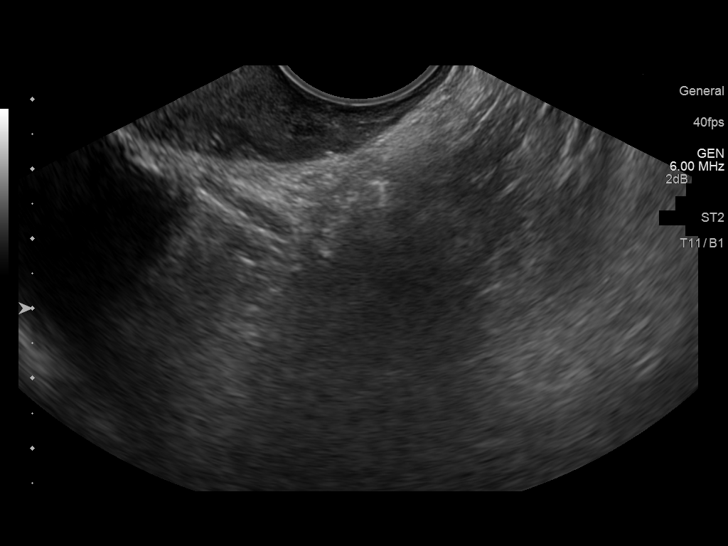
[im 60/85]
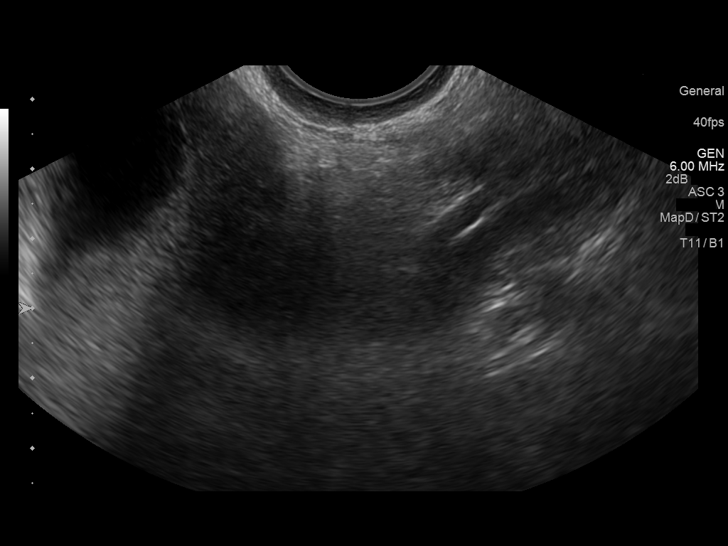
[im 66/85]
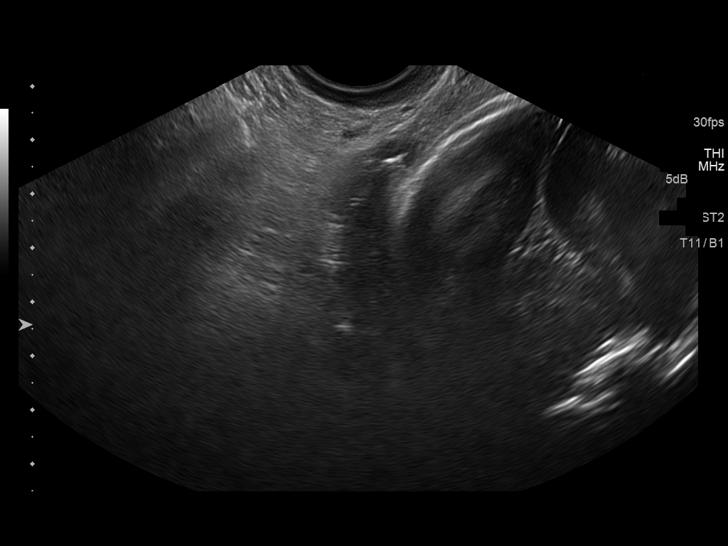
[im 72/85]
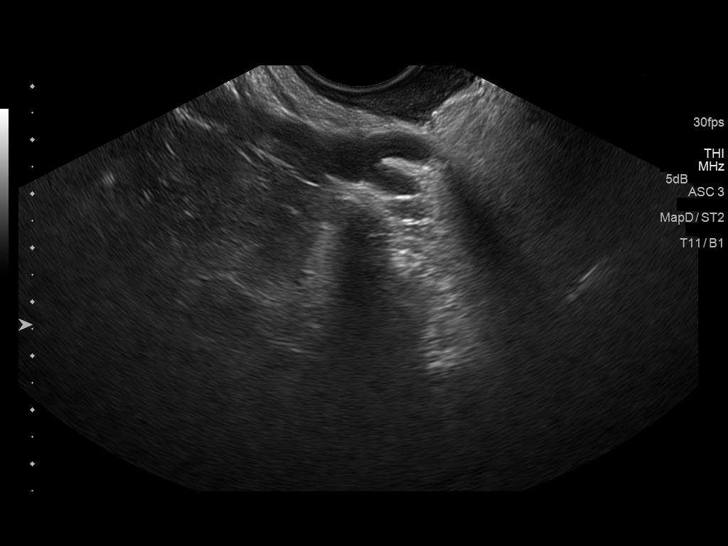
[im 78/85]
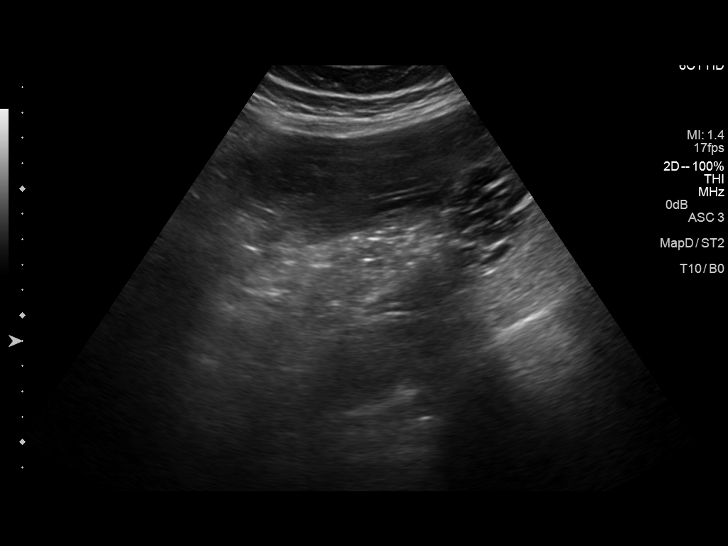
[im 85/85]
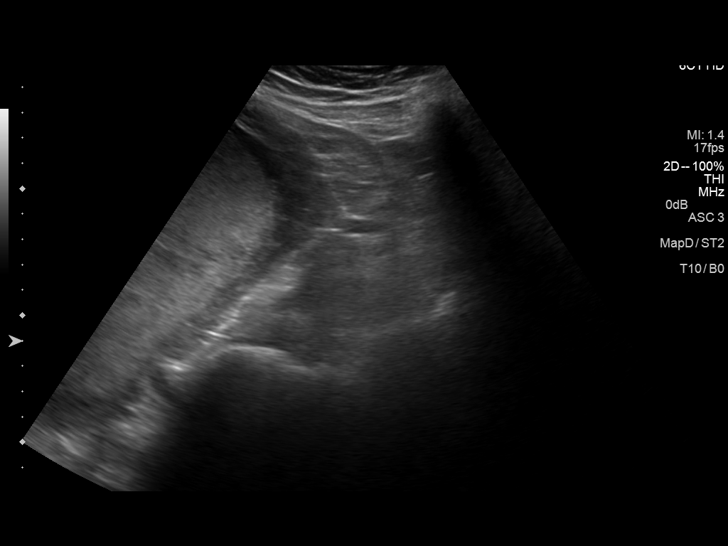

[14 of 28 positions shown; findings below may reference images not displayed]

FINDINGS: Number of Fetuses: 1

Heart Rate:  143 bpm

Movement: Yes

Presentation: Cephalic

Placental Location: Posterior

Previa:  No

Amniotic Fluid (Subjective):  Within normal limits.

BPD:  4.1cm 23w  3d

MATERNAL FINDINGS:

Cervix:  Appears closed.

Uterus/Adnexae: Ovaries not visualized. No adnexal mass identified.
No significant free fluid.
IMPRESSION: Intrauterine pregnancy of approximately 23 weeks 3 days with fetal
heart rate of 143 beats per min.

Lack of visualization of the ovaries.

This exam is performed on an emergent basis and does not
comprehensively evaluate fetal size, dating, or anatomy; follow-up
complete OB US should be considered if further fetal assessment is
warranted.

## 2019-10-05 ENCOUNTER — Encounter: Payer: Self-pay | Admitting: Certified Nurse Midwife

## 2019-10-10 ENCOUNTER — Other Ambulatory Visit: Payer: Self-pay

## 2019-10-10 ENCOUNTER — Encounter: Payer: Self-pay | Admitting: *Deleted

## 2019-10-10 ENCOUNTER — Emergency Department: Payer: Managed Care, Other (non HMO)

## 2019-10-10 DIAGNOSIS — H53149 Visual discomfort, unspecified: Secondary | ICD-10-CM | POA: Diagnosis not present

## 2019-10-10 DIAGNOSIS — R110 Nausea: Secondary | ICD-10-CM | POA: Diagnosis not present

## 2019-10-10 DIAGNOSIS — R519 Headache, unspecified: Secondary | ICD-10-CM | POA: Insufficient documentation

## 2019-10-10 NOTE — ED Triage Notes (Signed)
Pt to ED with headache x 3 weeks with a continuous migraine that has not responded to tylenol, ibuprofen or a migraine medication that pt can not recall the name. Pt was seen at [facility] today and sent to ED for head CT. Pt was not given medications at [facility]. NO hx of migraines. Intermittent nausea without vomiting. Mild light sensitivity.

## 2019-10-11 ENCOUNTER — Emergency Department
Admission: EM | Admit: 2019-10-11 | Discharge: 2019-10-11 | Disposition: A | Discharge status: Home / Self Care | Payer: Managed Care, Other (non HMO) | Attending: Emergency Medicine | Admitting: Emergency Medicine

## 2019-10-11 DIAGNOSIS — R519 Headache, unspecified: Secondary | ICD-10-CM

## 2019-10-11 LAB — POCT PREGNANCY, URINE: Preg Test, Ur: NEGATIVE

## 2019-10-11 MED ORDER — KETOROLAC TROMETHAMINE 30 MG/ML IJ SOLN
30.0000 mg | Freq: Once | INTRAMUSCULAR | Status: AC
  Administered 2019-10-11: 30 mg via INTRAVENOUS
  Filled 2019-10-11: qty 1

## 2019-10-11 MED ORDER — BUTALBITAL-APAP-CAFFEINE 50-325-40 MG PO TABS: 1.0000 | ORAL_TABLET | Freq: Four times a day (QID) | ORAL | 0 refills | Status: AC | PRN

## 2019-10-11 MED ORDER — DIPHENHYDRAMINE HCL 50 MG/ML IJ SOLN
25.0000 mg | Freq: Once | INTRAMUSCULAR | Status: AC
  Administered 2019-10-11: 25 mg via INTRAVENOUS
  Filled 2019-10-11: qty 1

## 2019-10-11 MED ORDER — METOCLOPRAMIDE HCL 5 MG/ML IJ SOLN
10.0000 mg | Freq: Once | INTRAMUSCULAR | Status: AC
  Administered 2019-10-11: 10 mg via INTRAVENOUS
  Filled 2019-10-11: qty 2

## 2019-10-11 NOTE — ED Provider Notes (Signed)
[facility] Emergency Department Provider Note  ____________________________________________   First MD Initiated Contact with Patient 10/11/19 0153     (approximate)  I have reviewed the triage vital signs and the nursing notes.   HISTORY  Chief Complaint Headache   HPI Sarah Hutchinson is a 29 y.o. female presents to the emergency department secondary to a 3-week history of persistent headache that the patient states has not responded to Tylenol ibuprofen and Excedrin Migraine.  Patient states that current pain score is 8 out of 10.  Patient also admits to mild light sensitivity.  Patient denies any weakness no numbness gait instability or visual changes.  Patient does admit to nausea however no vomiting.  Patient denies any previous history of chronic headaches.     Past Medical History:  Diagnosis Date  . Asthma   . Asthma     Patient Active Problem List   Diagnosis Date Noted  . Postpartum care following vaginal delivery 06/16/2015  . Left wrist injury 12/04/2013    Past Surgical History:  Procedure Laterality Date  . FOOT SURGERY Right     Prior to Admission medications   Medication Sig Start Date End Date Taking? Authorizing Provider  albuterol (PROVENTIL) (2.5 MG/3ML) 0.083% nebulizer solution Take 2.5 mg by nebulization every 6 (six) hours as needed for wheezing or shortness of breath. Reported on 06/14/2015    [provider]  Albuterol Sulfate (VENTOLIN HFA IN) Inhale 2 puffs into the lungs daily. Reported on 06/14/2015    [provider]  ferrous fumarate (HEMOCYTE - 106 MG FE) 325 (106 FE) MG TABS tablet Take 1 tablet by mouth.    [provider]  ibuprofen (ADVIL,MOTRIN) 600 MG tablet Take 1 tablet (600 mg total) by mouth every 6 (six) hours. 06/17/15   Ruffin Frederick, MD  norethindrone (ORTHO MICRONOR) 0.35 MG tablet Take 1 tablet (0.35 mg total) by mouth daily. 06/17/15   Ruffin Frederick, MD    Prenatal Vit-Fe Fumarate-FA (MULTIVITAMIN-PRENATAL) 27-0.8 MG TABS tablet Take 1 tablet by mouth daily at 12 noon.    [provider]  promethazine (PHENERGAN) 25 MG tablet Take 1 tablet (25 mg total) by mouth every 6 (six) hours as needed for nausea. 12/20/11 12/27/11  Trisha Mangle, MD    Allergies Patient has no known allergies.  Family History  Problem Relation Age of Onset  . Diabetes Mother   . Hypertension Mother     Social History Social History   Tobacco Use  . Smoking status: Never Smoker  . Smokeless tobacco: Never Used  Substance Use Topics  . Alcohol use: Yes    Comment: socially  . Drug use: No    Review of Systems Constitutional: No fever/chills Eyes: No visual changes. ENT: No sore throat. Cardiovascular: Denies chest pain. Respiratory: Denies shortness of breath. Gastrointestinal: No abdominal pain.  No nausea, no vomiting.  No diarrhea.  No constipation. Genitourinary: Negative for dysuria. Musculoskeletal: Negative for neck pain.  Negative for back pain. Integumentary: Negative for rash. Neurological: Negative for headaches, negative for focal weakness or numbness.  ____________________________________________   PHYSICAL EXAM:  VITAL SIGNS: ED Triage Vitals  Enc Vitals Group     BP 10/10/19 2205 137/84     Pulse Rate 10/10/19 2205 88     Resp 10/10/19 2205 18     Temp 10/10/19 2205 98.6 F (37 C)     Temp Source 10/10/19 2205 Oral     SpO2 10/10/19 2205 100 %  Weight 10/10/19 2205 110.2 kg (243 lb)     Height 10/10/19 2205 1.727 m (5\' 8" )     Head Circumference --      Peak Flow --      Pain Score 10/10/19 2213 8     Pain Loc --      Pain Edu? --      Excl. in GC? --     Constitutional: Alert and oriented.  Eyes: Conjunctivae are normal.  Mouth/Throat: Patient is wearing a mask. Neck: No stridor.  No meningeal signs.   Cardiovascular: Normal rate, regular rhythm. Good peripheral circulation. Grossly normal heart  sounds. Respiratory: Normal respiratory effort.  No retractions. Gastrointestinal: Soft and nontender. No distention.   Musculoskeletal: No lower extremity tenderness nor edema. No gross deformities of extremities. Neurologic:  Normal speech and language. No gross focal neurologic deficits are appreciated.  Skin:  Skin is warm, dry and intact. Psychiatric: Mood and affect are normal. Speech and behavior are normal.  ____________________________________________   LABS (all labs ordered are listed, but only abnormal results are displayed)  Labs Reviewed  POCT PREGNANCY, URINE   ____________________________________________  RADIOLOGY I, [place] Shanele Nissan, personally viewed and evaluated these images (plain radiographs) as part of my medical decision making, as well as reviewing the written report by the radiologist.  Official radiology report(s): CT Head Wo Contrast  Result Date: 10/10/2019 CLINICAL DATA:  Headache for 3 weeks, unresponsive to medication EXAM: CT HEAD WITHOUT CONTRAST TECHNIQUE: Contiguous axial images were obtained from the base of the skull through the vertex without intravenous contrast. COMPARISON:  None. FINDINGS: Brain: No evidence of acute infarction, hemorrhage, hydrocephalus, extra-axial collection or mass lesion/mass effect. Partially empty appearance of the sella. There is a slightly low position of the cerebellar tonsils extending approximately 5.6 mm below the foramen magnum. Vascular: No hyperdense vessel or unexpected calcification. Skull: No calvarial fracture or suspicious osseous lesion. No scalp swelling or hematoma. Sinuses/Orbits: Paranasal sinuses and mastoid air cells are predominantly clear. Included orbital structures are unremarkable. Other: None IMPRESSION: 1. No acute intracranial abnormality. 2. Partially empty appearance of the sella, nonspecific. 3. There is a slightly low position of the cerebellar tonsils extending approximately 5.6 mm below the  foramen magnum. Could be seen in the setting of Chiari I malformation or in the setting of acquired tonsillar ectasias such as with idiopathic intracranial hypertension. Electronically Signed   By: 10/12/2019 M.D.   On: 10/10/2019 22:48    ____________ Procedures   ____________________________________________   INITIAL IMPRESSION / MDM / ASSESSMENT AND PLAN / ED COURSE  As part of my medical decision making, I reviewed the following data within the electronic MEDICAL RECORD NUMBER  29 year old female present with above-stated history and physical exam secondary to headache without any focal neurological deficits.  CT scan of the head presents possibilities of idiopathic intracranial hypertension versus Chiari I malformation.  Patient was given IV Toradol Reglan and Benadryl in the emergency department with resolution of headache.  Patient will be prescribed Fioricet for home with recommendation to follow-up with outpatient neurology. ____________________________________________  FINAL CLINICAL IMPRESSION(S) / ED DIAGNOSES  Final diagnoses:  Acute nonintractable headache, unspecified headache type     MEDICATIONS GIVEN DURING THIS VISIT:  Medications  ketorolac (TORADOL) 30 MG/ML injection 30 mg (30 mg Intravenous Given 10/11/19 0243)  metoCLOPramide (REGLAN) injection 10 mg (10 mg Intravenous Given 10/11/19 0243)  diphenhydrAMINE (BENADRYL) injection 25 mg (25 mg Intravenous Given 10/11/19 0243)     ED  Discharge Orders    None      *Please note:  Briseidy Spark was evaluated in Emergency Department on 10/11/2019 for the symptoms described in the history of present illness. She was evaluated in the context of the global COVID-19 pandemic, which necessitated consideration that the patient might be at risk for infection with the SARS-CoV-2 virus that causes COVID-19. Institutional protocols and algorithms that pertain to the evaluation of patients at risk for COVID-19 are in a state of  rapid change based on information released by regulatory bodies including the CDC and federal and state organizations. These policies and algorithms were followed during the patient's care in the ED.  Some ED evaluations and interventions may be delayed as a result of limited staffing during the pandemic.*  Note:  This document was prepared using Dragon voice recognition software and may include unintentional dictation errors.   Darci Current, MD 10/11/19 [phone]

## 2019-11-03 ENCOUNTER — Other Ambulatory Visit (HOSPITAL_COMMUNITY): Payer: Self-pay | Admitting: Acute Care

## 2019-11-03 ENCOUNTER — Other Ambulatory Visit: Payer: Self-pay | Admitting: Acute Care

## 2019-11-03 DIAGNOSIS — G35 Multiple sclerosis: Secondary | ICD-10-CM

## 2019-11-12 ENCOUNTER — Ambulatory Visit
Admission: RE | Admit: 2019-11-12 | Discharge: 2019-11-12 | Disposition: A | Discharge status: Home / Self Care | Payer: Managed Care, Other (non HMO) | Source: Ambulatory Visit | Attending: Acute Care | Admitting: Acute Care

## 2019-11-12 DIAGNOSIS — G35 Multiple sclerosis: Secondary | ICD-10-CM | POA: Diagnosis present

## 2019-11-12 MED ORDER — GADOBUTROL 1 MMOL/ML IV SOLN
10.0000 mL | Freq: Once | INTRAVENOUS | Status: AC | PRN
  Administered 2019-11-12: 10 mL via INTRAVENOUS

## 2020-07-03 ENCOUNTER — Other Ambulatory Visit: Payer: Medicaid Other

## 2020-07-03 DIAGNOSIS — Z20822 Contact with and (suspected) exposure to covid-19: Secondary | ICD-10-CM

## 2020-07-04 LAB — SARS-COV-2, NAA 2 DAY TAT

## 2020-07-04 LAB — NOVEL CORONAVIRUS, NAA: SARS-CoV-2, NAA: NOT DETECTED

## 2021-02-06 DIAGNOSIS — J4530 Mild persistent asthma, uncomplicated: Secondary | ICD-10-CM | POA: Diagnosis not present

## 2021-02-06 DIAGNOSIS — G479 Sleep disorder, unspecified: Secondary | ICD-10-CM | POA: Diagnosis not present

## 2021-02-27 DIAGNOSIS — M25571 Pain in right ankle and joints of right foot: Secondary | ICD-10-CM | POA: Diagnosis not present

## 2021-02-27 DIAGNOSIS — M79671 Pain in right foot: Secondary | ICD-10-CM | POA: Diagnosis not present

## 2021-02-27 DIAGNOSIS — S93601A Unspecified sprain of right foot, initial encounter: Secondary | ICD-10-CM | POA: Diagnosis not present

## 2021-02-27 DIAGNOSIS — S93401A Sprain of unspecified ligament of right ankle, initial encounter: Secondary | ICD-10-CM | POA: Diagnosis not present

## 2021-04-29 DIAGNOSIS — Z8742 Personal history of other diseases of the female genital tract: Secondary | ICD-10-CM | POA: Diagnosis not present

## 2021-04-29 DIAGNOSIS — N926 Irregular menstruation, unspecified: Secondary | ICD-10-CM | POA: Diagnosis not present

## 2021-04-29 DIAGNOSIS — Z131 Encounter for screening for diabetes mellitus: Secondary | ICD-10-CM | POA: Diagnosis not present

## 2021-04-29 DIAGNOSIS — Z124 Encounter for screening for malignant neoplasm of cervix: Secondary | ICD-10-CM | POA: Diagnosis not present

## 2021-04-29 DIAGNOSIS — Z113 Encounter for screening for infections with a predominantly sexual mode of transmission: Secondary | ICD-10-CM | POA: Diagnosis not present

## 2021-04-29 DIAGNOSIS — Z01419 Encounter for gynecological examination (general) (routine) without abnormal findings: Secondary | ICD-10-CM | POA: Diagnosis not present

## 2021-04-29 LAB — HM PAP SMEAR: HM Pap smear: NEGATIVE

## 2021-11-12 DIAGNOSIS — B029 Zoster without complications: Secondary | ICD-10-CM | POA: Diagnosis not present

## 2021-11-20 DIAGNOSIS — H1089 Other conjunctivitis: Secondary | ICD-10-CM | POA: Diagnosis not present

## 2021-12-09 DIAGNOSIS — R238 Other skin changes: Secondary | ICD-10-CM | POA: Diagnosis not present

## 2021-12-30 ENCOUNTER — Emergency Department
Admission: EM | Admit: 2021-12-30 | Discharge: 2021-12-30 | Disposition: A | Discharge status: Home / Self Care | Payer: BC Managed Care – PPO | Attending: Emergency Medicine | Admitting: Emergency Medicine

## 2021-12-30 ENCOUNTER — Other Ambulatory Visit: Payer: Self-pay

## 2021-12-30 DIAGNOSIS — H05223 Edema of bilateral orbit: Secondary | ICD-10-CM | POA: Insufficient documentation

## 2021-12-30 DIAGNOSIS — R600 Localized edema: Secondary | ICD-10-CM

## 2021-12-30 DIAGNOSIS — H5789 Other specified disorders of eye and adnexa: Secondary | ICD-10-CM | POA: Diagnosis not present

## 2021-12-30 MED ORDER — ACETAMINOPHEN 500 MG PO TABS
1000.0000 mg | ORAL_TABLET | Freq: Once | ORAL | Status: AC
  Administered 2021-12-30: 1000 mg via ORAL
  Filled 2021-12-30: qty 2

## 2021-12-30 MED ORDER — TETRACAINE HCL 0.5 % OP SOLN
2.0000 [drp] | Freq: Once | OPHTHALMIC | Status: AC
  Administered 2021-12-30: 2 [drp] via OPHTHALMIC
  Filled 2021-12-30: qty 4

## 2021-12-30 MED ORDER — FLUORESCEIN SODIUM 1 MG OP STRP
1.0000 | ORAL_STRIP | Freq: Once | OPHTHALMIC | Status: AC
  Administered 2021-12-30: 1 via OPHTHALMIC
  Filled 2021-12-30: qty 1

## 2021-12-30 MED ORDER — PREDNISONE 10 MG PO TABS: 40.0000 mg | ORAL_TABLET | Freq: Every day | ORAL | 0 refills | Status: AC

## 2021-12-30 MED ORDER — PREDNISONE 20 MG PO TABS
40.0000 mg | ORAL_TABLET | Freq: Once | ORAL | Status: AC
  Administered 2021-12-30: 40 mg via ORAL
  Filled 2021-12-30: qty 2

## 2021-12-30 MED ORDER — FAMOTIDINE 20 MG PO TABS
20.0000 mg | ORAL_TABLET | Freq: Once | ORAL | Status: AC
  Administered 2021-12-30: 20 mg via ORAL
  Filled 2021-12-30: qty 1

## 2021-12-30 NOTE — ED Triage Notes (Signed)
Pt to ED for bilateral eye swelling (tissue around eyes) since this morning.. L eye began Unable to open L eye, able to open R eye.   Denies known ingestions, new medications, new foods. Denies pain but states eyes feel irritated as if they have foreign body. Denies loss of vision or pain in eyes.  States this may have happened due to sun exposure.

## 2021-12-30 NOTE — ED Provider Notes (Signed)
[facility] Provider Note    Event Date/Time   First MD Initiated Contact with Patient 12/30/21 1749     (approximate)   History   eye swelling   HPI  Sarah Hutchinson is a 31 y.o. female who presents today for evaluation of the left greater than right periorbital swelling.  Patient reports that she fell asleep with her sunglasses on at the beach and her face got very sunburned.  She reports that since that time she has had swelling to her face, and she feels like her left eye is more swollen than her right eye.  She denies any pain.  She denies any itchiness.  She denies vision changes or double vision.  She does not wear contact lenses.  No lower extremity swelling or swelling elsewhere.  She denies pain with moving her eyes.  No headache. Patient does have fake eyelashes that she reports have been there for 4 weeks. No discharge to her eye. No tongue/lip swelling, no SOB.  Patient Active Problem List   Diagnosis Date Noted   Postpartum care following vaginal delivery 06/16/2015   Left wrist injury 12/04/2013          Physical Exam   Triage Vital Signs: ED Triage Vitals  Enc Vitals Group     BP 12/30/21 1740 122/77     Pulse Rate 12/30/21 1740 95     Resp 12/30/21 1740 15     Temp 12/30/21 1740 98.9 F (37.2 C)     Temp Source 12/30/21 1740 Oral     SpO2 12/30/21 1740 95 %     Weight 12/30/21 1737 249 lb (112.9 kg)     Height 12/30/21 1737 5\' 8"  (1.727 m)     Head Circumference --      Peak Flow --      Pain Score 12/30/21 1736 0     Pain Loc --      Pain Edu? --      Excl. in GC? --     Most recent vital signs: Vitals:   12/30/21 1740 12/30/21 1857  BP: 122/77 120/70  Pulse: 95 88  Resp: 15 16  Temp: 98.9 F (37.2 C)   SpO2: 95% 96%    Physical Exam Vitals and nursing note reviewed.  Constitutional:      General: Awake and alert. No acute distress.    Appearance: Normal appearance. The patient is normal weight.  HENT:      Head: Normocephalic and atraumatic.     Mouth: Mucous membranes are moist.  Eyes:     General: PERRL. Normal EOMs. L>R periorbital swelling though swelling bilaterally. No eyelid erythema.  No injection.  No hyphema or hypopyon.  Normal and painless extraocular movements.  There is erythema to entirety of face sparing the periorbital areas.  No fluorescein uptake.  No rash. No chalazion or hordeolum. No proptosis.    Right eye: No discharge.        Left eye: No discharge.     Conjunctiva/sclera: Conjunctivae normal.  Cardiovascular:     Rate and Rhythm: Normal rate and regular rhythm.     Pulses: Normal pulses.     Heart sounds: Normal heart sounds Pulmonary:     Effort: Pulmonary effort is normal. No respiratory distress.     Breath sounds: Normal breath sounds.  Abdominal:     Abdomen is soft. There is no abdominal tenderness. No rebound or guarding. No distention. Musculoskeletal:  General: No swelling. Normal range of motion.     Cervical back: Normal range of motion and neck supple.  No lower extremity swelling or edema Skin:    General: Skin is warm and dry.     Capillary Refill: Capillary refill takes less than 2 seconds.     Findings: No rash.  Neurological:     Mental Status: The patient is awake and alert.      ED Results / Procedures / Treatments   Labs (all labs ordered are listed, but only abnormal results are displayed) Labs Reviewed - No data to display   EKG     RADIOLOGY     PROCEDURES:  Critical Care performed:   Procedures   MEDICATIONS ORDERED IN ED: Medications  tetracaine (PONTOCAINE) 0.5 % ophthalmic solution 2 drop (2 drops Both Eyes Given by Other 12/30/21 1753)  fluorescein ophthalmic strip 1 strip (1 strip Both Eyes Given by Other 12/30/21 1753)  predniSONE (DELTASONE) tablet 40 mg (40 mg Oral Given 12/30/21 1819)  famotidine (PEPCID) tablet 20 mg (20 mg Oral Given 12/30/21 1819)  acetaminophen (TYLENOL) tablet 1,000 mg (1,000 mg  Oral Given 12/30/21 1827)     IMPRESSION / MDM / ASSESSMENT AND PLAN / ED COURSE  I reviewed the triage vital signs and the nursing notes.  Differential diagnosis includes, but is not limited to, allergic reaction, edema from local skin injury. No history of nephrotic syndrome or anasarca, no swelling elsewhere to suggest nephrotic syndrome or anisarca. No periorbital erythema to suggest preseptal cellulitis. No discharge from eye.  Normal and full extraocular movements without pain, do not suspect orbital cellulitis.  No fluorescein uptake.  There is erythema to entirety of face sparing the periorbital areas consistent with history of falling asleep while wearing her sunglasses in the sun.   Patient was treated symptomatically with prednisone and famotidine for possible allergic reaction.  She was advised to have her fake eyelashes removed in case this is the source of irritation.  She has no vision changes. No tongue or lip swelling or SOB to suggest angioedema/anaphylaxis. She does not wear contact lenses or eyeglasses.  We discussed the importance of close outpatient follow-up and return precautions.  Patient's presentation is most consistent with acute, uncomplicated illness.  Clinical Course as of 12/30/21 1900  Tue Dec 30, 2021  0981 Patient reports mild improvement of her symptoms [JP]    Clinical Course User Index [JP] De Jaworski, Herb Grays, PA-C     FINAL CLINICAL IMPRESSION(S) / ED DIAGNOSES   Final diagnoses:  Periorbital edema of both eyes     Rx / DC Orders   ED Discharge Orders          Ordered    predniSONE (DELTASONE) 10 MG tablet  Daily        12/30/21 1853             Note:  This document was prepared using Dragon voice recognition software and may include unintentional dictation errors.   Keturah Shavers 12/30/21 1900    Dionne Bucy, MD 12/30/21 1925

## 2021-12-30 NOTE — Discharge Instructions (Addendum)
Take the medication as prescribed, starting tomorrow. Return for any new, worsening, or change in symptoms including eye pain, eye redness, pain with moving your eyes, vision changes, vomiting, swelling elsewhere in your body, or any other concerns. Please apply cool compresses to your eyes as well.

## 2022-01-28 NOTE — Progress Notes (Unsigned)
-- diettician, bartatic  New patient visit   Patient: Sarah Hutchinson   DOB: [birth year]   31 y.o. Female  MRN: [id number] Visit Date: 01/29/2022  Today's healthcare provider: Alfredia Ferguson, PA-C   Cc. Weight management, anxiety, hsv 2  Subjective    Sarah Hutchinson is a 31 y.o. female who presents today as a new patient to establish care.  HPI  Anxiety -Increasing, harder to deal with in recent months. Used to have a therapist but he retired. Reports a lot of her anxiety is related to weight, comparing herself to others.   Weight management -Regular exercise, goes to gym 5 times a week. "Healthy' diet, reports good fats, proteins, healthy carbs. Eats around 3 times a day. Is able to maintain her weight but if she misses the gym will gain weight.  -Dx of PCOS last year from GYN, tried max dose Metformin for 6 months with no difference in how she feels. Currently on OCP, monthly cycle. Questions about weight pills/shots, bariatric surgery.   HSV -May/June had a vesicular rash on her left ribs, tx as shingles, resolved. A few weeks later appeared on right ribs, treated again as shingles, viral culture returned HSV 2. Denies any lesions on mucosa, genitals. Reports protection during intercourse.  Denies current rash.      01/29/2022    8:36 AM  GAD 7 : Generalized Anxiety Score  Nervous, Anxious, on Edge 2  Control/stop worrying 1  Worry too much - different things 2  Trouble relaxing 2  Restless 2  Easily annoyed or irritable 1  Afraid - awful might happen 0  Total GAD 7 Score 10  Anxiety Difficulty Not difficult at all      Past Medical History:  Diagnosis Date   Anxiety 04/2021   Every since being diagnosed with PCOS ive had anxiety   Asthma    Asthma    HSV-2 infection    PCOS (polycystic ovarian syndrome)    Past Surgical History:  Procedure Laterality Date   FOOT SURGERY Right    FRACTURE SURGERY  08/2014   Planters plate reconstructive (R) foot   Family  Status  Relation Name Status   Mother Wynona Canes inge (Not Specified)   Father Alinda Money (Not Specified)   PGF Homero Fellers (Not Specified)   PGM Lamonte Richer (Not Specified)   Brother Oceanographer (Not Specified)   Pat Aunt Buren Kos (Not Specified)   Oneal Grout Vanessa Kick rosenfeld (Not Specified)   Brother Armed forces logistics/support/administrative officer (Not Specified)   Marylouise Stacks (Not Specified)   Family History  Problem Relation Age of Onset   Diabetes Mother    Hypertension Mother    Obesity Mother    Diabetes Father    Obesity Father    Diabetes Paternal Grandfather    Diabetes Paternal Grandmother    Diabetes Brother    Diabetes Paternal Aunt    Diabetes Paternal Uncle    Obesity Brother    Obesity Paternal Aunt    Social History   Socioeconomic History   Marital status: Single    Spouse name: Not on file   Number of children: Not on file   Years of education: Not on file   Highest education level: Not on file  Occupational History   Not on file  Tobacco Use   Smoking status: Never   Smokeless tobacco: Never  Substance and Sexual Activity   Alcohol use: Not Currently    Comment: socially   Drug use: No   Sexual  activity: Yes    Birth control/protection: Pill  Other Topics Concern   Not on file  Social History Narrative   ** Merged History Encounter **       Social Determinants of Health   Financial Resource Strain: Not on file  Food Insecurity: Not on file  Transportation Needs: Not on file  Physical Activity: Not on file  Stress: Not on file  Social Connections: Not on file   Outpatient Medications Prior to Visit  Medication Sig   albuterol (PROVENTIL) (2.5 MG/3ML) 0.083% nebulizer solution Take 2.5 mg by nebulization every 6 (six) hours as needed for wheezing or shortness of breath. Reported on 06/14/2015   Albuterol Sulfate (VENTOLIN HFA IN) Inhale 2 puffs into the lungs daily. Reported on 06/14/2015   norgestimate-ethinyl estradiol (ORTHO-CYCLEN) 0.25-35 MG-MCG tablet Take 1 tablet by mouth daily.    [DISCONTINUED] ferrous fumarate (HEMOCYTE - 106 MG FE) 325 (106 FE) MG TABS tablet Take 1 tablet by mouth.   [DISCONTINUED] ibuprofen (ADVIL,MOTRIN) 600 MG tablet Take 1 tablet (600 mg total) by mouth every 6 (six) hours.   [DISCONTINUED] norethindrone (ORTHO MICRONOR) 0.35 MG tablet Take 1 tablet (0.35 mg total) by mouth daily.   [DISCONTINUED] Prenatal Vit-Fe Fumarate-FA (MULTIVITAMIN-PRENATAL) 27-0.8 MG TABS tablet Take 1 tablet by mouth daily at 12 noon.   [DISCONTINUED] promethazine (PHENERGAN) 25 MG tablet Take 1 tablet (25 mg total) by mouth every 6 (six) hours as needed for nausea.   No facility-administered medications prior to visit.   Allergies  Allergen Reactions   Latex Itching    Immunization History  Administered Date(s) Administered   Influenza,trivalent, recombinat, inj, PF 03/27/2015   Tdap 03/27/2015    Health Maintenance  Topic Date Due   COVID-19 Vaccine (1) Never done   Hepatitis C Screening  Never done   PAP-Cervical Cytology Screening  Never done   PAP SMEAR-Modifier  Never done   INFLUENZA VACCINE  01/27/2022   TETANUS/TDAP  03/26/2025   HIV Screening  Completed   HPV VACCINES  Aged Out    Patient Care Team: Alfredia Ferguson, PA-C as PCP - General (Physician Assistant) Sonny Dandy, CNM as Midwife (Obstetrics)  Review of Systems  Constitutional:  Positive for unexpected weight change.  Respiratory:  Positive for shortness of breath.   Psychiatric/Behavioral:  The patient is nervous/anxious.      Objective    BP 125/74 (BP Location: Left Arm, Patient Position: Sitting, Cuff Size: Large)   Pulse (!) 117   Ht 5' 7.5" (1.715 m)   Wt 257 lb 1.6 oz (116.6 kg)   LMP 01/10/2022 (Approximate)   SpO2 99%   BMI 39.67 kg/m  Blood pressure 125/74, pulse (!) 117, height 5' 7.5" (1.715 m), weight 257 lb 1.6 oz (116.6 kg), last menstrual period 01/10/2022, SpO2 99 %, unknown if currently breastfeeding.   Physical Exam Constitutional:       General: She is awake.     Appearance: She is well-developed.  HENT:     Head: Normocephalic.  Eyes:     Conjunctiva/sclera: Conjunctivae normal.  Cardiovascular:     Rate and Rhythm: Normal rate and regular rhythm.     Heart sounds: Normal heart sounds.  Pulmonary:     Effort: Pulmonary effort is normal.     Breath sounds: Normal breath sounds.  Skin:    General: Skin is warm.  Neurological:     Mental Status: She is alert and oriented to person, place, and time.  Psychiatric:  Attention and Perception: Attention normal.        Mood and Affect: Mood normal.        Speech: Speech normal.        Behavior: Behavior is cooperative.       01/29/2022    8:36 AM  GAD 7 : Generalized Anxiety Score  Nervous, Anxious, on Edge 2  Control/stop worrying 1  Worry too much - different things 2  Trouble relaxing 2  Restless 2  Easily annoyed or irritable 1  Afraid - awful might happen 0  Total GAD 7 Score 10  Anxiety Difficulty Not difficult at all     Depression Screen    01/29/2022    8:31 AM  PHQ 2/9 Scores  PHQ - 2 Score 0  PHQ- 9 Score 2   No results found for any visits on 01/29/22.  Assessment & Plan      Problem List Items Addressed This Visit       Endocrine   PCOS (polycystic ovarian syndrome)    Explained condition.  Tried/failed metformin Discussed glp-1 medications Referred to dietician On OCP, regular cycles.  F/u 6 mo      Relevant Orders   Amb ref to Medical Nutrition Therapy-MNT     Other   HSV-2 (herpes simplex virus 2) infection    Unusual presentation. Pt showed pictures today.  Advised if it recurs, we will treat again and re-do viral cultures to confirm HSV 2       Obesity (BMI 30-39.9) - Primary    Reviewed labs from GYN Discussed diet, exercise, surgical options, medical options. Referred to dietician. Referred to bariatric surgery for consult.  F/u 6 mo and can discussed medications if she is interested      Relevant Orders    Amb Referral to Bariatric Surgery   Amb ref to Medical Nutrition Therapy-MNT   Prediabetes    Last A1c 11/22 reviewed, 6.0% Advised pt in 6 mo we can recheck this and lipids if we are still considering weight loss meds.        Relevant Orders   Amb ref to Medical Nutrition Therapy-MNT   Anxiety    Gave resources for virtual therapy options and local emergent options.        Return in about 6 months (around 08/01/2022) for weight Management.     I, Alfredia Ferguson, PA-C have reviewed all documentation for this visit. The documentation on 01/29/2022 for the exam, diagnosis, procedures, and orders are all accurate and complete.  Alfredia Ferguson, PA-C [facility] [address] [place], [place], [address] Office: [phone] Fax: [phone]   [facility]

## 2022-01-29 ENCOUNTER — Encounter: Payer: Self-pay | Admitting: Physician Assistant

## 2022-01-29 ENCOUNTER — Ambulatory Visit (INDEPENDENT_AMBULATORY_CARE_PROVIDER_SITE_OTHER): Payer: BC Managed Care – PPO | Admitting: Physician Assistant

## 2022-01-29 VITALS — BP 125/74 | HR 117 | Ht 67.5 in | Wt 257.1 lb

## 2022-01-29 DIAGNOSIS — B009 Herpesviral infection, unspecified: Secondary | ICD-10-CM | POA: Insufficient documentation

## 2022-01-29 DIAGNOSIS — R7303 Prediabetes: Secondary | ICD-10-CM | POA: Diagnosis not present

## 2022-01-29 DIAGNOSIS — E282 Polycystic ovarian syndrome: Secondary | ICD-10-CM

## 2022-01-29 DIAGNOSIS — E669 Obesity, unspecified: Secondary | ICD-10-CM | POA: Diagnosis not present

## 2022-01-29 DIAGNOSIS — F419 Anxiety disorder, unspecified: Secondary | ICD-10-CM | POA: Insufficient documentation

## 2022-01-29 NOTE — Assessment & Plan Note (Signed)
Gave resources for virtual therapy options and local emergent options.

## 2022-01-29 NOTE — Patient Instructions (Addendum)
[email or web address]   Emergency Mental Health Services:  [facility] (like an urgent care for mental health) [address], [place], [place] [address] [phone]  [facility] Several different mental health services, has a crisis center  [place], [place] [address], [place], [place] [address]  [phone] Same-Day Access Hours:  Monday, Wednesday and Friday, 8am - 3pm Crisis & Diversion Center: Walk-In Crisis Hours: 7 days/week, 8am - 12am -  [facility] [employer] -  Patent examiner Drop-Off (side door)

## 2022-01-29 NOTE — Assessment & Plan Note (Addendum)
Reviewed labs from GYN Discussed diet, exercise, surgical options, medical options. Referred to dietician. Referred to bariatric surgery for consult.  F/u 6 mo and can discussed medications if she is interested

## 2022-01-29 NOTE — Assessment & Plan Note (Signed)
Last A1c 11/22 reviewed, 6.0% Advised pt in 6 mo we can recheck this and lipids if we are still considering weight loss meds.

## 2022-01-29 NOTE — Assessment & Plan Note (Signed)
Explained condition.  Tried/failed metformin Discussed glp-1 medications Referred to dietician On OCP, regular cycles.  F/u 6 mo

## 2022-01-29 NOTE — Assessment & Plan Note (Signed)
Unusual presentation. Pt showed pictures today.  Advised if it recurs, we will treat again and re-do viral cultures to confirm HSV 2

## 2022-02-05 ENCOUNTER — Encounter: Payer: BC Managed Care – PPO | Attending: Physician Assistant | Admitting: Dietician

## 2022-02-05 ENCOUNTER — Encounter: Payer: Self-pay | Admitting: Dietician

## 2022-02-05 VITALS — Ht 67.0 in | Wt 257.9 lb

## 2022-02-05 DIAGNOSIS — E282 Polycystic ovarian syndrome: Secondary | ICD-10-CM

## 2022-02-05 DIAGNOSIS — R7303 Prediabetes: Secondary | ICD-10-CM

## 2022-02-05 DIAGNOSIS — E669 Obesity, unspecified: Secondary | ICD-10-CM

## 2022-02-05 NOTE — Patient Instructions (Signed)
Start tracking food intake using a free app, to gauge how things are going with calorie goal and balance of carbs, protein, and fat.  Current meal plan is for 1600 calories daily while engaging in exercise regularly -- with a balance of 40-45% carbohydrate, 25-30% protein, and 30% fat.  Use menus provided for adding variety of proteins and veggies.  Great job with the exercise!!

## 2022-02-05 NOTE — Progress Notes (Signed)
Medical Nutrition Therapy: Visit start time: 1445  end time: 1540  Assessment:   Referral Diagnosis: PCOS, pre-diabetes, obesity Other medical history/ diagnoses: none significant Psychosocial issues/ stress concerns: patient reports high stress level, feels she is managing "OK"  Medications, supplements: reconciled list in medical record   Preferred learning method:  Visual    Current weight: 257.9lbs with shoes Height: 5'7" BMI: 40.39 Patient's personal weight goal: 160-180lbs  Progress and evaluation:  Patient reports difficulty losing weight for past 1-2 years; she was working on weight loss with exercise and diet changes with no results; went to MD and was diagnosed with PCOS. Reports gain of about 17lbs in 2023.  She has stopped fast foods, most sodas.   Most recent labwork (04/29/21) indicates HbA1C of 6.0% Food allergies: none Special diet practices: no Patient seeks help with weight loss to improve health risk, prevent diabetes Next PCP appt is 6 months   Dietary Intake:  Usual eating pattern includes 3 meals and 1-2 snacks per day. Dining out frequency: 2-3 meals per week. Who plans meals/ buys groceries? Self, mom Who prepares meals? Self, mom  Breakfast: eggs; cottage cheese and fresh strawberries; yogurt and fruit and granola Snack: banana/ pretzels/ cheese Lunch: chicken + veg ie asparagus + sometimes rice; shrimp Snack: same as am Supper: mom sometimes cooks, or out -- salad with protein (doesn't last long, eats snack after); chicken + veg + starch Snack: none Beverages: water, some with sugar free flavoring (1-2x a day); rarely soda  Physical activity: cardio + strength training 45-60 minutes, 4-5 times a week   Intervention:   Nutrition Care Education:   Basic nutrition: basic food groups; appropriate nutrient balance; appropriate meal and snack schedule; general nutrition guidelines; suitable meal options to ensure variety of choices Weight control:  determining reasonable weight loss rate; importance of low sugar and low fat choices; portion control and appropriate food portions; estimated energy needs for weight loss at 1600 kcal, provided guidance for 45% CHO, 25% pro, 30% fat; role of physical activity; benefits of tracking food intake and importance of averaging intake vs concern over specific choices PCOS and pre-diabetes: appropriate meal and snack schedule; appropriate carb intake and balance, healthy carb choices; role of fiber, protein, fat; physical activity  Other intervention notes: Patient has been working on diet and lifestyle changes to promote health and weight loss. She is motivated to continue. Established additional goals for change with direction from patient.   Nutritional Diagnosis:  [name]-2.1 Inpaired nutrition utilization and [place]-2.2 Altered nutrition-related laboratory As related to PCOS, pre-diabetes.  As evidenced by diagnostic testing by MD, elevated HbA1C. [place]-3.3 Overweight/obesity As related to PCOS, history of excess calories, history of inadequate physical activity.  As evidenced by patient with current BMI of 40.39, working on lifestyle changes to promote weight loss and reduce health risk.   Education Materials given:  Designer, industrial/product with food lists, sample meal pattern Sample menus Build a [employer] handout Visit summary with goals/ instructions   Learner/ who was taught:  Patient   Level of understanding: Verbalizes/ demonstrates competency  Demonstrated degree of understanding via:   Teach back Learning barriers: None  Willingness to learn/ readiness for change: Eager, change in progress   Monitoring and Evaluation:  Dietary intake, exercise, BG control, and body weight      follow up:  03/11/22 at 1:15pm

## 2022-03-03 ENCOUNTER — Other Ambulatory Visit: Payer: Self-pay | Admitting: Physician Assistant

## 2022-03-03 DIAGNOSIS — E669 Obesity, unspecified: Secondary | ICD-10-CM

## 2022-03-09 ENCOUNTER — Encounter: Payer: Self-pay | Admitting: Physician Assistant

## 2022-03-11 ENCOUNTER — Ambulatory Visit: Payer: BC Managed Care – PPO | Admitting: Dietician

## 2022-03-17 ENCOUNTER — Other Ambulatory Visit: Payer: Self-pay | Admitting: Physician Assistant

## 2022-03-19 ENCOUNTER — Other Ambulatory Visit: Payer: Self-pay | Admitting: Physician Assistant

## 2022-03-19 DIAGNOSIS — J4520 Mild intermittent asthma, uncomplicated: Secondary | ICD-10-CM

## 2022-03-19 MED ORDER — ALBUTEROL SULFATE HFA 108 (90 BASE) MCG/ACT IN AERS: 1.0000 | INHALATION_SPRAY | Freq: Four times a day (QID) | RESPIRATORY_TRACT | 0 refills | Status: DC | PRN

## 2022-03-20 DIAGNOSIS — R7303 Prediabetes: Secondary | ICD-10-CM | POA: Diagnosis not present

## 2022-03-20 DIAGNOSIS — Z01818 Encounter for other preprocedural examination: Secondary | ICD-10-CM | POA: Diagnosis not present

## 2022-03-20 DIAGNOSIS — E282 Polycystic ovarian syndrome: Secondary | ICD-10-CM | POA: Diagnosis not present

## 2022-03-23 ENCOUNTER — Other Ambulatory Visit: Payer: Self-pay | Admitting: Physician Assistant

## 2022-03-23 DIAGNOSIS — R7303 Prediabetes: Secondary | ICD-10-CM | POA: Diagnosis not present

## 2022-03-23 DIAGNOSIS — J4520 Mild intermittent asthma, uncomplicated: Secondary | ICD-10-CM

## 2022-03-23 DIAGNOSIS — E282 Polycystic ovarian syndrome: Secondary | ICD-10-CM | POA: Diagnosis not present

## 2022-03-23 DIAGNOSIS — Z6841 Body Mass Index (BMI) 40.0 and over, adult: Secondary | ICD-10-CM | POA: Diagnosis not present

## 2022-03-24 ENCOUNTER — Other Ambulatory Visit: Payer: Self-pay | Admitting: Surgery

## 2022-03-24 ENCOUNTER — Other Ambulatory Visit (HOSPITAL_COMMUNITY): Payer: Self-pay | Admitting: Surgery

## 2022-03-24 ENCOUNTER — Ambulatory Visit: Payer: Self-pay | Admitting: Surgery

## 2022-03-26 ENCOUNTER — Ambulatory Visit
Admission: RE | Admit: 2022-03-26 | Discharge: 2022-03-26 | Disposition: A | Discharge status: Home / Self Care | Payer: BC Managed Care – PPO | Source: Ambulatory Visit | Attending: Physician Assistant | Admitting: Physician Assistant

## 2022-03-26 ENCOUNTER — Ambulatory Visit
Admission: RE | Admit: 2022-03-26 | Discharge: 2022-03-26 | Disposition: A | Discharge status: Home / Self Care | Payer: BC Managed Care – PPO | Source: Ambulatory Visit | Attending: Surgery | Admitting: Surgery

## 2022-03-26 DIAGNOSIS — R7303 Prediabetes: Secondary | ICD-10-CM | POA: Insufficient documentation

## 2022-03-26 DIAGNOSIS — E282 Polycystic ovarian syndrome: Secondary | ICD-10-CM | POA: Diagnosis not present

## 2022-03-26 DIAGNOSIS — Z6841 Body Mass Index (BMI) 40.0 and over, adult: Secondary | ICD-10-CM | POA: Diagnosis not present

## 2022-03-26 DIAGNOSIS — Z01818 Encounter for other preprocedural examination: Secondary | ICD-10-CM | POA: Insufficient documentation

## 2022-03-30 DIAGNOSIS — F5089 Other specified eating disorder: Secondary | ICD-10-CM | POA: Diagnosis not present

## 2022-04-01 DIAGNOSIS — F5089 Other specified eating disorder: Secondary | ICD-10-CM | POA: Diagnosis not present

## 2022-04-06 ENCOUNTER — Encounter: Payer: BC Managed Care – PPO | Attending: Physician Assistant | Admitting: Dietician

## 2022-04-06 ENCOUNTER — Encounter: Payer: Self-pay | Admitting: Dietician

## 2022-04-06 DIAGNOSIS — Z713 Dietary counseling and surveillance: Secondary | ICD-10-CM | POA: Diagnosis not present

## 2022-04-06 DIAGNOSIS — E669 Obesity, unspecified: Secondary | ICD-10-CM

## 2022-04-06 NOTE — Progress Notes (Signed)
Nutrition Assessment for Bariatric Surgery Medical Nutrition Therapy Appt Start Time: 8:05    End Time: 9:01  Patient was seen on 04/06/2022 for Pre-Operative Nutrition Assessment. Letter of approval faxed to [facility] bariatric surgery program coordinator on 04/16/2022.   Referral stated Supervised Weight Loss (SWL) visits needed: 0  Pt completed visits.   Pt has cleared nutrition requirements.   Planned surgery: Sleeve Gastrectomy   NUTRITION ASSESSMENT   Anthropometrics  Start weight at [facility]: 257.6 lbs (date: 04/06/2022)  Height: 67 in BMI: 40.35 kg/m2     Clinical  Medical hx: anxiety, asthma Medications: Albuterol inhaler, Sprintec  Labs: Triglycerides 232; A1C 5.7; glucose 129 (pt states not fasting) Notable signs/symptoms: none noted Any previous deficiencies? No  Micronutrient Nutrition Focused Physical Exam: Hair: No issues observed Eyes: No issues observed Mouth: No issues observed Neck: No issues observed Nails: No issues observed Skin: No issues observed  Lifestyle & Dietary Hx  Patient lives with boyfriend and son. The pt and her boyfriend performs the food shopping and the pt and her boyfriend prepares the meals. She reports that she typically skips or misses 0 out of 21 possible meals per week. She may have 1-2 meals per week that are take-out or at a restaurant.  Patient works as Administrator, arts. She denies binge eating and denies feeling shame and/or guilt after eating too much food.  She denies having used laxatives or vomiting to facilitate weight loss. She denies emotional eating during times of stress. She states that she knows the difference between hunger and thirst and can tell when she is full.  Physical Activity: Gym 3-5 times a week, 1-1.5 hours  Sleep Hygiene: duration and quality: good, 7-9 hours per night  Current Patient Perceived Stress Level as stated by pt on a scale of 1-10: 4      Stress Management Techniques: walk away  and come back to what she was doing; call Mom  Fruit servings per week: 2-3 Non starchy vegetable servings per week: 14 Whole Grains per week: 3  24-Hr Dietary Recall First Meal: scrambled eggs or oatmeal or cottage cheese and fruit Snack:  Second Meal: left overs from night before, chicken or salmon, rice, vegetable Snack: pretzels humus Third Meal: chicken or salmon, rice, vegetable Snack:  Beverages: water with flavorings, Milo zero sugar tea  Alcoholic beverages per week: 0   Estimated Energy Needs Calories: 1600   NUTRITION DIAGNOSIS  Overweight/obesity ([place]-3.3) related to past poor dietary habits and physical inactivity as evidenced by patient w/ planned sleeve surgery following dietary guidelines for continued weight loss.    NUTRITION INTERVENTION  Nutrition counseling (C-1) and education (E-2) to facilitate bariatric surgery goals.  Educated pt on micronutrient deficiencies post surgery and strategies to mitigate that risk   Pre-Op Goals Reviewed with the Patient Track food and beverage intake (pen and paper, MyFitness Pal, Baritastic app, etc.) Make healthy food choices while monitoring portion sizes Consume 3 meals per day or try to eat every 3-5 hours Avoid concentrated sugars and fried foods Keep sugar & fat in the single digits per serving on food labels Practice CHEWING your food (aim for applesauce consistency) Practice not drinking 15 minutes before, during, and 30 minutes after each meal and snack Avoid all carbonated beverages (ex: soda, sparkling beverages)  Limit caffeinated beverages (ex: coffee, tea, energy drinks) Avoid all sugar-sweetened beverages (ex: regular soda, sports drinks)  Avoid alcohol  Aim for 64-100 ounces of FLUID daily (with at least half  of fluid intake being plain water)  Aim for at least 60-80 grams of PROTEIN daily Look for a liquid protein source that contains ?15 g protein and ?5 g carbohydrate (ex: shakes, drinks, shots) Make  a list of non-food related activities Physical activity is an important part of a healthy lifestyle so keep it moving! The goal is to reach 150 minutes of exercise per week, including cardiovascular and weight baring activity.  *Goals that are bolded indicate the pt would like to start working towards these  Handouts Provided Include  Bariatric Surgery handouts (Nutrition Visits, Pre-Op Goals, Protein Shakes, Vitamins & Minerals)  Learning Style & Readiness for Change Teaching method utilized: Visual & Auditory  Demonstrated degree of understanding via: Teach Back  Readiness Level: Preparation Barriers to learning/adherence to lifestyle change: none identified  RD's Notes for Next Visit     MONITORING & EVALUATION Dietary intake, weekly physical activity, body weight, and pre-op goals reached at next nutrition visit.    Next Steps  Pt has completed visits. No further supervised visits required/recomended  Patient is to follow up at [facility] for Pre-Op Class >2 weeks before surgery for further nutrition education.

## 2022-06-12 ENCOUNTER — Encounter (HOSPITAL_COMMUNITY): Payer: Self-pay | Admitting: Surgery

## 2022-06-19 ENCOUNTER — Ambulatory Visit (HOSPITAL_COMMUNITY): Payer: BC Managed Care – PPO | Admitting: Anesthesiology

## 2022-06-19 ENCOUNTER — Encounter (HOSPITAL_COMMUNITY): Payer: Self-pay | Admitting: Surgery

## 2022-06-19 ENCOUNTER — Ambulatory Visit (HOSPITAL_COMMUNITY)
Admission: RE | Admit: 2022-06-19 | Discharge: 2022-06-19 | Disposition: A | Discharge status: Home / Self Care | Payer: BC Managed Care – PPO | Attending: Surgery | Admitting: Surgery

## 2022-06-19 ENCOUNTER — Encounter (HOSPITAL_COMMUNITY)
Admission: RE | Disposition: A | Discharge status: Home / Self Care | Payer: Self-pay | Source: Home / Self Care | Attending: Surgery

## 2022-06-19 ENCOUNTER — Other Ambulatory Visit: Payer: Self-pay

## 2022-06-19 DIAGNOSIS — K2950 Unspecified chronic gastritis without bleeding: Secondary | ICD-10-CM | POA: Insufficient documentation

## 2022-06-19 DIAGNOSIS — K219 Gastro-esophageal reflux disease without esophagitis: Secondary | ICD-10-CM | POA: Diagnosis not present

## 2022-06-19 DIAGNOSIS — E282 Polycystic ovarian syndrome: Secondary | ICD-10-CM | POA: Insufficient documentation

## 2022-06-19 DIAGNOSIS — Z6841 Body Mass Index (BMI) 40.0 and over, adult: Secondary | ICD-10-CM | POA: Insufficient documentation

## 2022-06-19 DIAGNOSIS — Z01818 Encounter for other preprocedural examination: Secondary | ICD-10-CM | POA: Diagnosis not present

## 2022-06-19 DIAGNOSIS — B9681 Helicobacter pylori [H. pylori] as the cause of diseases classified elsewhere: Secondary | ICD-10-CM | POA: Diagnosis not present

## 2022-06-19 DIAGNOSIS — R7303 Prediabetes: Secondary | ICD-10-CM | POA: Insufficient documentation

## 2022-06-19 DIAGNOSIS — K2970 Gastritis, unspecified, without bleeding: Secondary | ICD-10-CM | POA: Diagnosis not present

## 2022-06-19 DIAGNOSIS — K259 Gastric ulcer, unspecified as acute or chronic, without hemorrhage or perforation: Secondary | ICD-10-CM | POA: Diagnosis not present

## 2022-06-19 DIAGNOSIS — K3189 Other diseases of stomach and duodenum: Secondary | ICD-10-CM | POA: Diagnosis not present

## 2022-06-19 DIAGNOSIS — J45909 Unspecified asthma, uncomplicated: Secondary | ICD-10-CM | POA: Diagnosis not present

## 2022-06-19 HISTORY — PX: BIOPSY: SHX5522

## 2022-06-19 HISTORY — PX: ESOPHAGOGASTRODUODENOSCOPY: SHX5428

## 2022-06-19 LAB — PREGNANCY, URINE: Preg Test, Ur: NEGATIVE

## 2022-06-19 SURGERY — EGD (ESOPHAGOGASTRODUODENOSCOPY)
Anesthesia: Monitor Anesthesia Care

## 2022-06-19 MED ORDER — PROPOFOL 10 MG/ML IV BOLUS
INTRAVENOUS | Status: DC | PRN
  Administered 2022-06-19 (×7): 20 mg via INTRAVENOUS

## 2022-06-19 MED ORDER — PANTOPRAZOLE SODIUM 40 MG PO TBEC: 40.0000 mg | DELAYED_RELEASE_TABLET | Freq: Every day | ORAL | 3 refills | Status: DC

## 2022-06-19 MED ORDER — PROPOFOL 500 MG/50ML IV EMUL
INTRAVENOUS | Status: DC | PRN
  Administered 2022-06-19: 13 ug/kg/min via INTRAVENOUS

## 2022-06-19 MED ORDER — PROPOFOL 1000 MG/100ML IV EMUL
INTRAVENOUS | Status: AC
  Filled 2022-06-19: qty 100

## 2022-06-19 MED ORDER — LACTATED RINGERS IV SOLN: INTRAVENOUS | Status: DC

## 2022-06-19 MED ORDER — LIDOCAINE HCL 1 % IJ SOLN
INTRAMUSCULAR | Status: DC | PRN
  Administered 2022-06-19: 20 mg via INTRADERMAL
  Administered 2022-06-19: 50 mg via INTRADERMAL

## 2022-06-19 NOTE — Transfer of Care (Signed)
Immediate Anesthesia Transfer of Care Note  Patient: Sarah Hutchinson  Procedure(s) Performed: ESOPHAGOGASTRODUODENOSCOPY (EGD) BIOPSY  Patient Location: PACU and Endoscopy Unit  Anesthesia Type:MAC  Level of Consciousness: awake, alert , oriented, and patient cooperative  Airway & Oxygen Therapy: Patient Spontanous Breathing and Patient connected to face mask oxygen  Post-op Assessment: Report given to RN and Post -op Vital signs reviewed and stable  Post vital signs: Reviewed and stable  Last Vitals:  Vitals Value Taken Time  BP 117/97 06/19/22 1245  Temp    Pulse 98 06/19/22 1246  Resp 17 06/19/22 1246  SpO2 100 % 06/19/22 1246  Vitals shown include unvalidated device data.  Last Pain:  Vitals:   06/19/22 1011  TempSrc: Temporal  PainSc: 0-No pain         Complications: No notable events documented.

## 2022-06-19 NOTE — Op Note (Signed)
[facility] Patient Name: Sarah Hutchinson Procedure Date: 06/19/2022 MRN: [id number] Attending MD: Felicie Morn , ,  Date of Birth: [birth year] CSN: [id number] Age: 31 Admit Type: Outpatient Procedure:                Upper GI endoscopy Indications:              Preoperative assessment for bariatric surgery to                            treat morbid obesity Providers:                Felicie Morn, Brien Mates, Technician Referring MD:              Medicines:                Monitored Anesthesia Care Complications:            No immediate complications. Estimated blood loss:                            Minimal. Estimated Blood Loss:     Estimated blood loss was minimal. Procedure:                Pre-Anesthesia Assessment:                           - Prior to the procedure, a History and Physical                            was performed, and patient medications and                            allergies were reviewed. The patient is competent.                            The risks and benefits of the procedure and the                            sedation options and risks were discussed with the                            patient. All questions were answered and informed                            consent was obtained. Patient identification and                            proposed procedure were verified in the                            pre-procedure area. Mental Status Examination:                            alert and oriented. Airway Examination: normal                            oropharyngeal airway  and neck mobility. Respiratory                            Examination: clear to auscultation. CV Examination:                            normal. ASA Grade Assessment: II - A patient with                            mild systemic disease. After reviewing the risks                            and benefits, the patient was deemed in                             satisfactory condition to undergo the procedure.                            The anesthesia plan was to use monitored anesthesia                            care (MAC). Immediately prior to administration of                            medications, the patient was re-assessed for                            adequacy to receive sedatives. The heart rate,                            respiratory rate, oxygen saturations, blood                            pressure, adequacy of pulmonary ventilation, and                            response to care were monitored throughout the                            procedure. The physical status of the patient was                            re-assessed after the procedure.                           After obtaining informed consent, the endoscope was                            passed under direct vision. Throughout the                            procedure, the patient's blood pressure, pulse, and  oxygen saturations were monitored continuously. The                            [id number]-H190 ([id number]) Olympus endoscope was introduced                            through the mouth, and advanced to the second part                            of duodenum. The upper GI endoscopy was                            accomplished without difficulty. The patient                            tolerated the procedure well. Scope In: Scope Out: Findings:      The examined esophagus was normal.      The gastroesophageal flap valve was visualized endoscopically and       classified as Hill Grade II (fold present, opens with respiration).      Multiple dispersed small erosions with no stigmata of recent bleeding       were found in the prepyloric region of the stomach. Biopsies were taken       with a cold forceps for histology. Verification of patient       identification for the specimen was done. Estimated blood loss was       minimal.      The examined duodenum  was normal. Impression:               - Normal esophagus.                           - Gastroesophageal flap valve classified as Hill                            Grade II (fold present, opens with respiration).                           - Erosive gastropathy with no stigmata of recent                            bleeding. Biopsied.                           - Normal examined duodenum. Moderate Sedation:      Moderate (conscious) sedation was personally administered by an       anesthesia professional. The following parameters were monitored: oxygen       saturation, heart rate, blood pressure, and response to care. Total       physician intraservice time was 15 minutes. Recommendation:           - Discharge patient to home.                           - Resume previous diet.                           -  PPI Therapy                           - Await h. pylori and path results Procedure Code(s):        --- Professional ---                           [phone], Esophagogastroduodenoscopy, flexible,                            transoral; with biopsy, single or multiple Diagnosis Code(s):        --- Professional ---                           K31.89, Other diseases of stomach and duodenum                           Z01.818, Encounter for other preprocedural                            examination                           E66.01, Morbid (severe) obesity due to excess                            calories CPT copyright 2022 [employer]. All rights reserved. The codes documented in this report are preliminary and upon coder review may  be revised to meet current compliance requirements. Doffing,  06/19/2022 12:45:21 PM This report has been signed electronically. Number of Addenda: 0

## 2022-06-19 NOTE — Anesthesia Preprocedure Evaluation (Signed)
Anesthesia Evaluation  Patient identified by MRN, date of birth, ID band Patient awake    Reviewed: Allergy & Precautions, NPO status , Patient's Chart, lab work & pertinent test results  History of Anesthesia Complications Negative for: history of anesthetic complications  Airway Mallampati: III  TM Distance: >3 FB Neck ROM: Full    Dental  (+) Teeth Intact   Pulmonary asthma    Pulmonary exam normal        Cardiovascular negative cardio ROS Normal cardiovascular exam     Neuro/Psych negative neurological ROS     GI/Hepatic Neg liver ROS,GERD  ,,  Endo/Other    Morbid obesityPCOS  Renal/GU negative Renal ROS  negative genitourinary   Musculoskeletal negative musculoskeletal ROS (+)    Abdominal   Peds  Hematology negative hematology ROS (+)   Anesthesia Other Findings   Reproductive/Obstetrics                             Anesthesia Physical Anesthesia Plan  ASA: 3  Anesthesia Plan: MAC   Post-op Pain Management: Minimal or no pain anticipated   Induction: Intravenous  PONV Risk Score and Plan: 2 and Propofol infusion, TIVA and Treatment may vary due to age or medical condition  Airway Management Planned: Natural Airway, Nasal Cannula and Simple Face Mask  Additional Equipment: None  Intra-op Plan:   Post-operative Plan:   Informed Consent: I have reviewed the patients History and Physical, chart, labs and discussed the procedure including the risks, benefits and alternatives for the proposed anesthesia with the patient or authorized representative who has indicated his/her understanding and acceptance.       Plan Discussed with:   Anesthesia Plan Comments:        Anesthesia Quick Evaluation

## 2022-06-19 NOTE — Anesthesia Postprocedure Evaluation (Signed)
Anesthesia Post Note  Patient: Aleane Wesenberg  Procedure(s) Performed: ESOPHAGOGASTRODUODENOSCOPY (EGD) BIOPSY     Patient location during evaluation: Endoscopy Anesthesia Type: MAC Level of consciousness: awake and alert Pain management: pain level controlled Vital Signs Assessment: post-procedure vital signs reviewed and stable Respiratory status: spontaneous breathing, nonlabored ventilation and respiratory function stable Cardiovascular status: blood pressure returned to baseline and stable Postop Assessment: no apparent nausea or vomiting Anesthetic complications: no   No notable events documented.  Last Vitals:  Vitals:   06/19/22 1245 06/19/22 1250  BP: (!) 117/97 136/79  Pulse: 96 98  Resp: 19 19  Temp: 37 C   SpO2: 100% 98%    Last Pain:  Vitals:   06/19/22 1300  TempSrc:   PainSc: 0-No pain                 Lidia Collum

## 2022-06-19 NOTE — H&P (Signed)
Admitting Physician: Hyman Hopes Gagan Dillion  Service: Bariatric surgery  CC: EGD prior to sleeve gastrectomy  Subjective   HPI: Sarah Hutchinson is an 31 y.o. female who is here for EGD prior to sleeve gastrectomy.  Past Medical History:  Diagnosis Date   Anxiety 04/2021   Every since being diagnosed with PCOS ive had anxiety   Asthma    Asthma    HSV-2 infection    PCOS (polycystic ovarian syndrome)     Past Surgical History:  Procedure Laterality Date   FOOT SURGERY Right    FRACTURE SURGERY  08/2014   Planters plate reconstructive (R) foot    Family History  Problem Relation Age of Onset   Diabetes Mother    Hypertension Mother    Obesity Mother    Diabetes Father    Obesity Father    Diabetes Paternal Grandfather    Diabetes Paternal Grandmother    Diabetes Brother    Diabetes Paternal Aunt    Diabetes Paternal Uncle    Obesity Brother    Obesity Paternal Aunt     Social:  reports that she has never smoked. She has never used smokeless tobacco. She reports that she does not currently use alcohol. She reports that she does not use drugs.  Allergies:  Allergies  Allergen Reactions   Latex Itching    Medications: Current Outpatient Medications  Medication Instructions   albuterol (PROVENTIL) 2.5 mg, Nebulization, Every 6 hours PRN, Reported on 06/14/2015   albuterol (VENTOLIN HFA) 108 (90 Base) MCG/ACT inhaler INHALE 2 INHALATIONS INTO THE LUNGS EVERY 6 HOURS AS NEEDED FOR WHEEZING   norgestimate-ethinyl estradiol (SPRINTEC 28) 0.25-35 MG-MCG tablet 1 tablet, Oral, Daily    ROS - all of the below systems have been reviewed with the patient and positives are indicated with bold text General: chills, fever or night sweats Eyes: blurry vision or double vision ENT: epistaxis or sore throat Allergy/Immunology: itchy/watery eyes or nasal congestion Hematologic/Lymphatic: bleeding problems, blood clots or swollen lymph nodes Endocrine: temperature intolerance  or unexpected weight changes Breast: new or changing breast lumps or nipple discharge Resp: cough, shortness of breath, or wheezing CV: chest pain or dyspnea on exertion GI: as per HPI GU: dysuria, trouble voiding, or hematuria MSK: joint pain or joint stiffness Neuro: TIA or stroke symptoms Derm: pruritus and skin lesion changes Psych: anxiety and depression  Objective   PE Blood pressure 137/67, pulse 89, temperature 98.6 F (37 C), temperature source Temporal, resp. rate 13, height 5\' 7"  (1.702 m), weight 116 kg, last menstrual period 06/12/2022, unknown if currently breastfeeding. Constitutional: NAD; conversant; no deformities Eyes: Moist conjunctiva; no lid lag; anicteric; PERRL Neck: Trachea midline; no thyromegaly Lungs: Normal respiratory effort; no tactile fremitus CV: RRR; no palpable thrills; no pitting edema GI: Abd Soft, nontender; no palpable hepatosplenomegaly MSK: Normal range of motion of extremities; no clubbing/cyanosis Psychiatric: Appropriate affect; alert and oriented x3 Lymphatic: No palpable cervical or axillary lymphadenopathy  Results for orders placed or performed during the hospital encounter of 06/19/22 (from the past 24 hour(s))  Pregnancy, urine     Status: None   Collection Time: 06/19/22  9:59 AM  Result Value Ref Range   Preg Test, Ur NEGATIVE NEGATIVE    Imaging Orders  No imaging studies ordered today     Assessment and Plan  Morbid obesity with body mass index (BMI) of 40.0 to 44.9 in adult (CMS-HCC)  PCOS (polycystic ovarian syndrome)  Prediabetes  Preoperative testing   06/21/22  Sarah Hutchinson is a 31 y.o. female who is seen for bariatric surgery consultation. The patient has morbid obesity with a BMI of Body mass index is 40.44 kg/m. and the following conditions related to obesity: prediabetes, PCOS.  We discussed the surgical options to treat obesity and its associated comorbidity. After discussing the available procedures in the  region, we discussed in great detail the surgeries I offer: robotic sleeve gastrectomy and robotic roux-en-y gastric bypass. We discussed the procedures themselves as well as their risks, benefits and alternatives. I entered the patient's basic information into the [facility] Metabolic Surgery Risk/Benefit Calculator to facilitate this discussion.   After a full discussion and all questions answered, the patient is interested in pursuing a robotic sleeve gastrectomy with upper endoscopy  We will initiate the bariatric surgery preoperative pathway to include the following: - Bloodwork - Dietician consult - Chest x-ray - EKG - Psychology evaluation - Upper endoscopy  Today she presnts for upper endoscopy with biopsy. I explained my rational for performing upper endoscopy in my bariatric patients. During the procedure I will biopsy for H. Pylori, evaluate for hiatal hernia, evaluate for reflux esophagitis and look for any other abnormalities that may influence the procedure. We discussed the risks, benefits and alternative to this procedure and the patient granted consent to proceed.     Felicie Morn, MD  [facility] Use [email or web address] to contact on call provider

## 2022-06-22 ENCOUNTER — Encounter (HOSPITAL_COMMUNITY): Payer: Self-pay | Admitting: Surgery

## 2022-06-24 LAB — SURGICAL PATHOLOGY

## 2022-06-30 ENCOUNTER — Telehealth: Payer: Self-pay

## 2022-06-30 NOTE — Telephone Encounter (Signed)
Patient reports GI will send in three different prescription today to her pharmacy. I did advise to fu with them if she does not get medications.

## 2022-06-30 NOTE — Telephone Encounter (Signed)
-----   Message from Gwyneth Sprout, FNP sent at 06/24/2022  7:58 PM EST ----- Pt on protonix; however, I do not seen Abx for Hpylori. Please call to confirm she has received from GI; recommend she contact GI if she has not received. ----- Message ----- From: Interface, Lab In [facility] Sent: 06/24/2022   5:00 PM EST To: Mikey Kirschner, PA-C

## 2022-07-01 DIAGNOSIS — A048 Other specified bacterial intestinal infections: Secondary | ICD-10-CM | POA: Diagnosis not present

## 2022-07-01 DIAGNOSIS — Z6841 Body Mass Index (BMI) 40.0 and over, adult: Secondary | ICD-10-CM | POA: Diagnosis not present

## 2022-07-01 DIAGNOSIS — Z09 Encounter for follow-up examination after completed treatment for conditions other than malignant neoplasm: Secondary | ICD-10-CM | POA: Diagnosis not present

## 2022-07-13 ENCOUNTER — Encounter: Payer: BC Managed Care – PPO | Attending: Surgery | Admitting: Skilled Nursing Facility1

## 2022-07-13 DIAGNOSIS — E669 Obesity, unspecified: Secondary | ICD-10-CM | POA: Diagnosis not present

## 2022-07-16 ENCOUNTER — Encounter: Payer: Self-pay | Admitting: Skilled Nursing Facility1

## 2022-07-16 NOTE — Progress Notes (Signed)
Pre-Operative Nutrition Class:    Patient was seen on 07/13/2022 for Pre-Operative Bariatric Surgery Education at [facility]    Surgery date: 08/05/2022 Surgery type: sleeve Start weight at [facility]: 257.6    The following the learning objectives were met by the patient during this course: Identify Pre-Op Dietary Goals and will begin 2 weeks pre-operatively Identify appropriate sources of fluids and proteins  State protein recommendations and appropriate sources pre and post-operatively Identify Post-Operative Dietary Goals and will follow for 2 weeks post-operatively Identify appropriate multivitamin and calcium sources Describe the need for physical activity post-operatively and will follow MD recommendations State when to call healthcare provider regarding medication questions or post-operative complications When having a diagnosis of diabetes understanding hypoglycemia symptoms and the inclusion of 1 complex carbohydrate per meal  Handouts given during class include: Pre-Op Bariatric Surgery Diet Handout Protein Shake Handout Post-Op Bariatric Surgery Nutrition Handout [facility] Program Information Flyer Support Group Information Flyer [facility] Bariatric Supplements Price List  Follow-Up Plan: Patient will follow-up at [facility] 2 weeks post operatively for diet advancement per MD.

## 2022-07-28 ENCOUNTER — Ambulatory Visit: Payer: Self-pay | Admitting: Surgery

## 2022-07-28 DIAGNOSIS — Z01818 Encounter for other preprocedural examination: Secondary | ICD-10-CM

## 2022-08-04 ENCOUNTER — Ambulatory Visit: Payer: BC Managed Care – PPO | Admitting: Physician Assistant

## 2022-08-14 NOTE — Patient Instructions (Signed)
SURGICAL WAITING ROOM VISITATION  Patients having surgery or a procedure may have no more than 2 support people in the waiting area - these visitors may rotate.    Children under the age of 54 must have an adult with them who is not the patient.  Due to an increase in RSV and influenza rates and associated hospitalizations, children ages 30 and under may not visit patients in [place].  If the patient needs to stay at the hospital during part of their recovery, the visitor guidelines for inpatient rooms apply. Pre-op nurse will coordinate an appropriate time for 1 support person to accompany patient in pre-op.  This support person may not rotate.    Please refer to the [facility] website for the visitor guidelines for Inpatients (after your surgery is over and you are in a regular room).    Your procedure is scheduled on: 07/2722   Report to [facility] Main Entrance    Report to admitting at 5:15 AM   Call this number if you have problems the morning of surgery [phone]   MORNING OF SURGERY DRINK:   DRINK 1 G2 drink BEFORE YOU LEAVE HOME, DRINK ALL OF THE  G2 DRINK AT ONE TIME.   NO SOLID FOOD AFTER 6:00 PM THE NIGHT BEFORE YOUR SURGERY. YOU MAY DRINK CLEAR FLUIDS. THE G2 DRINK YOU DRINK BEFORE YOU LEAVE HOME WILL BE THE LAST FLUIDS YOU DRINK BEFORE SURGERY.  PAIN IS EXPECTED AFTER SURGERY AND WILL NOT BE COMPLETELY ELIMINATED. AMBULATION AND TYLENOL WILL HELP REDUCE INCISIONAL AND GAS PAIN. MOVEMENT IS KEY!  YOU ARE EXPECTED TO BE OUT OF BED WITHIN 4 HOURS OF ADMISSION TO YOUR PATIENT ROOM.  SITTING IN THE RECLINER THROUGHOUT THE DAY IS IMPORTANT FOR DRINKING FLUIDS AND MOVING GAS THROUGHOUT THE GI TRACT.  COMPRESSION STOCKINGS SHOULD BE WORN [employer] UNLESS YOU ARE WALKING.   INCENTIVE SPIROMETER SHOULD BE USED EVERY HOUR WHILE AWAKE TO DECREASE POST-OPERATIVE COMPLICATIONS SUCH AS PNEUMONIA.  WHEN DISCHARGED HOME, IT IS IMPORTANT TO  CONTINUE TO WALK EVERY HOUR AND USE THE INCENTIVE SPIROMETER EVERY HOUR.    You may have the following liquids until 4:30 AM DAY OF SURGERY  Water Non-Citrus Juices (without pulp, NO RED-Apple, White grape, White cranberry) Black Coffee (NO MILK/CREAM OR CREAMERS, sugar ok)  Clear Tea (NO MILK/CREAM OR CREAMERS, sugar ok) regular and decaf                             Plain Jell-O (NO RED)                                           Fruit ices (not with fruit pulp, NO RED)                                     Popsicles (NO RED)                                                               Sports drinks like Gatorade (NO  RED)                 The day of surgery:  Drink ONE (1) Pre-Surgery G2 at 4:30 AM the morning of surgery. Drink in one sitting. Do not sip.  This drink was given to you during your hospital  pre-op appointment visit. Nothing else to drink after completing the  Pre-Surgery G2.          If you have questions, please contact your surgeon's office.   FOLLOW BOWEL PREP AND ANY ADDITIONAL PRE OP INSTRUCTIONS YOU RECEIVED FROM YOUR SURGEON'S OFFICE!!!     Oral Hygiene is also important to reduce your risk of infection.                                    Remember - BRUSH YOUR TEETH THE MORNING OF SURGERY WITH YOUR REGULAR TOOTHPASTE  DENTURES WILL BE REMOVED PRIOR TO SURGERY PLEASE DO NOT APPLY "Poly grip" OR ADHESIVES!!!   Take these medicines the morning of surgery with A SIP OF WATER: Tylenol, Inhalers, Pantoprazole   DO NOT TAKE ANY ORAL DIABETIC MEDICATIONS DAY OF YOUR SURGERY  How to Manage Your Diabetes Before and After Surgery  Why is it important to control my blood sugar before and after surgery? Improving blood sugar levels before and after surgery helps healing and can limit problems. A way of improving blood sugar control is eating a healthy diet by:  Eating less sugar and carbohydrates  Increasing activity/exercise  Talking with your doctor about reaching  your blood sugar goals High blood sugars (greater than 180 mg/dL) can raise your risk of infections and slow your recovery, so you will need to focus on controlling your diabetes during the weeks before surgery. Make sure that the doctor who takes care of your diabetes knows about your planned surgery including the date and location.  How do I manage my blood sugar before surgery? Check your blood sugar at least 4 times a day, starting 2 days before surgery, to make sure that the level is not too high or low. Check your blood sugar the morning of your surgery when you wake up and every 2 hours until you get to the Short Stay unit. If your blood sugar is less than 70 mg/dL, you will need to treat for low blood sugar: Do not take insulin. Treat a low blood sugar (less than 70 mg/dL) with  cup of clear juice (cranberry or apple), 4 glucose tablets, OR glucose gel. Recheck blood sugar in 15 minutes after treatment (to make sure it is greater than 70 mg/dL). If your blood sugar is not greater than 70 mg/dL on recheck, call [phone] for further instructions. Report your blood sugar to the short stay nurse when you get to Short Stay.  If you are admitted to the hospital after surgery: Your blood sugar will be checked by the staff and you will probably be given insulin after surgery (instead of oral diabetes medicines) to make sure you have good blood sugar levels. The goal for blood sugar control after surgery is 80-180 mg/dL.  Reviewed and Endorsed by [facility] Patient Education Committee, August 2015                              You may not have any metal on your body including hair pins, jewelry, and body  piercing             Do not wear make-up, lotions, powders, perfumes, or deodorant  Do not wear nail polish including gel and S&S, artificial/acrylic nails, or any other type of covering on natural nails including finger and toenails. If you have artificial nails, gel coating, etc. that  needs to be removed by a nail salon please have this removed prior to surgery or surgery may need to be canceled/ delayed if the surgeon/ anesthesia feels like they are unable to be safely monitored.   Do not shave  48 hours prior to surgery.    Do not bring valuables to the hospital. [employer].   Contacts, glasses, dentures or bridgework may not be worn into surgery.   Bring small overnight bag day of surgery.   DO NOT [place]. PHARMACY WILL DISPENSE MEDICATIONS LISTED ON YOUR MEDICATION LIST TO YOU DURING YOUR ADMISSION [place]!              Please read over the following fact sheets you were given: IF [place] [phone] Hutchinson    If you received a COVID test during your pre-op visit  it is requested that you wear a mask when out in public, stay away from anyone that may not be feeling well and notify your surgeon if you develop symptoms. If you test positive for Covid or have been in contact with anyone that has tested positive in the last 10 days please notify you surgeon.    [place] - Preparing for Surgery Before surgery, you can play an important role.  Because skin is not sterile, your skin needs to be as free of germs as possible.  You can reduce the number of germs on your skin by washing with CHG (chlorahexidine gluconate) soap before surgery.  CHG is an antiseptic cleaner which kills germs and bonds with the skin to continue killing germs even after washing. Please DO NOT use if you have an allergy to CHG or antibacterial soaps.  If your skin becomes reddened/irritated stop using the CHG and inform your nurse when you arrive at Short Stay. Do not shave (including legs and underarms) for at least 48 hours prior to the first CHG shower.  You may shave your face/neck.  Please follow these instructions carefully:  1.  Shower with CHG  Soap the night before surgery and the  morning of surgery.  2.  If you choose to wash your hair, wash your hair first as usual with your normal  shampoo.  3.  After you shampoo, rinse your hair and body thoroughly to remove the shampoo.                             4.  Use CHG as you would any other liquid soap.  You can apply chg directly to the skin and wash.  Gently with a scrungie or clean washcloth.  5.  Apply the CHG Soap to your body ONLY FROM THE NECK DOWN.   Do   not use on face/ open                           Wound or open sores. Avoid contact with  eyes, ears mouth and   genitals (private parts).                       Wash face,  Genitals (private parts) with your normal soap.             6.  Wash thoroughly, paying special attention to the area where your    surgery  will be performed.  7.  Thoroughly rinse your body with warm water from the neck down.  8.  DO NOT shower/wash with your normal soap after using and rinsing off the CHG Soap.                9.  Pat yourself dry with a clean towel.            10.  Wear clean pajamas.            11.  Place clean sheets on your bed the night of your first shower and do not  sleep with pets. Day of Surgery : Do not apply any lotions/deodorants the morning of surgery.  Please wear clean clothes to the hospital/surgery center.  FAILURE TO FOLLOW THESE INSTRUCTIONS MAY RESULT IN THE CANCELLATION OF YOUR SURGERY  PATIENT SIGNATURE_________________________________  NURSE SIGNATURE__________________________________  ________________________________________________________________________  Sarah Hutchinson  An incentive spirometer is a tool that can help keep your lungs clear and active. This tool measures how well you are filling your lungs with each breath. Taking long deep breaths may help reverse or decrease the chance of developing breathing (pulmonary) problems (especially infection) following: A long period of time when you are  unable to move or be active. BEFORE THE PROCEDURE  If the spirometer includes an indicator to show your best effort, your nurse or respiratory therapist will set it to a desired goal. If possible, sit up straight or lean slightly forward. Try not to slouch. Hold the incentive spirometer in an upright position. INSTRUCTIONS FOR USE  Sit on the edge of your bed if possible, or sit up as far as you can in bed or on a chair. Hold the incentive spirometer in an upright position. Breathe out normally. Place the mouthpiece in your mouth and seal your lips tightly around it. Breathe in slowly and as deeply as possible, raising the piston or the ball toward the top of the column. Hold your breath for 3-5 seconds or for as long as possible. Allow the piston or ball to fall to the bottom of the column. Remove the mouthpiece from your mouth and breathe out normally. Rest for a few seconds and repeat Steps 1 through 7 at least 10 times every 1-2 hours when you are awake. Take your time and take a few normal breaths between deep breaths. The spirometer may include an indicator to show your best effort. Use the indicator as a goal to work toward during each repetition. After each set of 10 deep breaths, practice coughing to be sure your lungs are clear. If you have an incision (the cut made at the time of surgery), support your incision when coughing by placing a pillow or rolled up towels firmly against it. Once you are able to get out of bed, walk around indoors and cough well. You may stop using the incentive spirometer when instructed by your caregiver.  RISKS AND COMPLICATIONS Take your time so you do not get dizzy or light-headed. If you are in pain, you may need to take or ask for pain medication  before doing incentive spirometry. It is harder to take a deep breath if you are having pain. AFTER USE Rest and breathe slowly and easily. It can be helpful to keep track of a log of your progress. Your  caregiver can provide you with a simple table to help with this. If you are using the spirometer at home, follow these instructions: [place] IF:  You are having difficultly using the spirometer. You have trouble using the spirometer as often as instructed. Your pain medication is not giving enough relief while using the spirometer. You develop fever of 100.5 F (38.1 C) or higher. SEEK IMMEDIATE MEDICAL CARE IF:  You cough up bloody sputum that had not been present before. You develop fever of 102 F (38.9 C) or greater. You develop worsening pain at or near the incision site. MAKE SURE YOU:  Understand these instructions. Will watch your condition. Will get help right away if you are not doing well or get worse. Document Released: 10/26/2006 Document Revised: 09/07/2011 Document Reviewed: 12/27/2006 Exit[employer] Patient Information 2014 [employer], [place].   ________________________________________________________________________ WHAT IS A BLOOD TRANSFUSION? Blood Transfusion Information  A transfusion is the replacement of blood or some of its parts. Blood is made up of multiple cells which provide different functions. Red blood cells carry oxygen and are used for blood loss replacement. White blood cells fight against infection. Platelets control bleeding. Plasma helps clot blood. Other blood products are available for specialized needs, such as hemophilia or other clotting disorders. BEFORE THE TRANSFUSION  Who gives blood for transfusions?  Healthy volunteers who are fully evaluated to make sure their blood is safe. This is blood bank blood. Transfusion therapy is the safest it has ever been in the practice of medicine. Before blood is taken from a donor, a complete history is taken to make sure that person has no history of diseases nor engages in risky social behavior (examples are intravenous drug use or sexual activity with multiple partners). The donor's travel history  is screened to minimize risk of transmitting infections, such as malaria. The donated blood is tested for signs of infectious diseases, such as HIV and hepatitis. The blood is then tested to be sure it is compatible with you in order to minimize the chance of a transfusion reaction. If you or a relative donates blood, this is often done in anticipation of surgery and is not appropriate for emergency situations. It takes many days to process the donated blood. RISKS AND COMPLICATIONS Although transfusion therapy is very safe and saves many lives, the main dangers of transfusion include:  Getting an infectious disease. Developing a transfusion reaction. This is an allergic reaction to something in the blood you were given. Every precaution is taken to prevent this. The decision to have a blood transfusion has been considered carefully by your caregiver before blood is given. Blood is not given unless the benefits outweigh the risks. AFTER THE TRANSFUSION Right after receiving a blood transfusion, you will usually feel much better and more energetic. This is especially true if your red blood cells have gotten low (anemic). The transfusion raises the level of the red blood cells which carry oxygen, and this usually causes an energy increase. The nurse administering the transfusion will monitor you carefully for complications. HOME CARE INSTRUCTIONS  No special instructions are needed after a transfusion. You may find your energy is better. Speak with your caregiver about any limitations on activity for underlying diseases you may have. [place]  IF:  Your condition is not improving after your transfusion. You develop redness or irritation at the intravenous (IV) site. SEEK IMMEDIATE MEDICAL CARE IF:  Any of the following symptoms occur over the next 12 hours: Shaking chills. You have a temperature by mouth above 102 F (38.9 C), not controlled by medicine. Chest, back, or muscle pain. People  around you feel you are not acting correctly or are confused. Shortness of breath or difficulty breathing. Dizziness and fainting. You get a rash or develop hives. You have a decrease in urine output. Your urine turns a dark color or changes to pink, red, or brown. Any of the following symptoms occur over the next 10 days: You have a temperature by mouth above 102 F (38.9 C), not controlled by medicine. Shortness of breath. Weakness after normal activity. The white part of the eye turns yellow (jaundice). You have a decrease in the amount of urine or are urinating less often. Your urine turns a dark color or changes to pink, red, or brown. Document Released: 06/12/2000 Document Revised: 09/07/2011 Document Reviewed: 01/30/2008 [facility] Patient Information 2014 [place], [place].  _______________________________________________________________________

## 2022-08-14 NOTE — Progress Notes (Signed)
COVID Vaccine Completed:  Date of COVID positive in last 90 days:  PCP - Mikey Kirschner, PA Cardiologist -   Chest x-ray - 03/26/22 Epic EKG - 03/26/22 Epic Stress Test -  ECHO -  Cardiac Cath -  Pacemaker/ICD device last checked: Spinal Cord Stimulator:  Bowel Prep -   Sleep Study -  CPAP -   Fasting Blood Sugar - preDM Checks Blood Sugar _____ times a day  Last dose of GLP1 agonist-  N/A GLP1 instructions:  N/A   Last dose of SGLT-2 inhibitors-  N/A SGLT-2 instructions: N/A   Blood Thinner Instructions: Aspirin Instructions: Last Dose:  Activity level:  Can go up a flight of stairs and perform activities of daily living without stopping and without symptoms of chest pain or shortness of breath.  Able to exercise without symptoms  Unable to go up a flight of stairs without symptoms of     Anesthesia review:   Patient denies shortness of breath, fever, cough and chest pain at PAT appointment  Patient verbalized understanding of instructions that were given to them at the PAT appointment. Patient was also instructed that they will need to review over the PAT instructions again at home before surgery.

## 2022-08-17 ENCOUNTER — Encounter (HOSPITAL_COMMUNITY): Payer: Self-pay

## 2022-08-17 ENCOUNTER — Encounter (HOSPITAL_COMMUNITY)
Admission: RE | Admit: 2022-08-17 | Discharge: 2022-08-17 | Disposition: A | Discharge status: Home / Self Care | Payer: BC Managed Care – PPO | Source: Ambulatory Visit | Attending: Surgery | Admitting: Surgery

## 2022-08-17 VITALS — BP 138/84 | HR 96 | Temp 98.6°F | Resp 14 | Ht 67.5 in | Wt 263.0 lb

## 2022-08-17 DIAGNOSIS — R7303 Prediabetes: Secondary | ICD-10-CM | POA: Diagnosis not present

## 2022-08-17 DIAGNOSIS — Z01812 Encounter for preprocedural laboratory examination: Secondary | ICD-10-CM | POA: Diagnosis not present

## 2022-08-17 DIAGNOSIS — Z01818 Encounter for other preprocedural examination: Secondary | ICD-10-CM

## 2022-08-17 HISTORY — DX: Family history of other specified conditions: Z84.89

## 2022-08-17 LAB — CBC WITH DIFFERENTIAL/PLATELET
Abs Immature Granulocytes: 0.07 10*3/uL (ref 0.00–0.07)
Basophils Absolute: 0.1 10*3/uL (ref 0.0–0.1)
Basophils Relative: 1 %
Eosinophils Absolute: 0.3 10*3/uL (ref 0.0–0.5)
Eosinophils Relative: 4 %
HCT: 43.6 % (ref 36.0–46.0)
Hemoglobin: 13.7 g/dL (ref 12.0–15.0)
Immature Granulocytes: 1 %
Lymphocytes Relative: 31 %
Lymphs Abs: 2.1 10*3/uL (ref 0.7–4.0)
MCH: 26.9 pg (ref 26.0–34.0)
MCHC: 31.4 g/dL (ref 30.0–36.0)
MCV: 85.5 fL (ref 80.0–100.0)
Monocytes Absolute: 0.3 10*3/uL (ref 0.1–1.0)
Monocytes Relative: 4 %
Neutro Abs: 4 10*3/uL (ref 1.7–7.7)
Neutrophils Relative %: 59 %
Platelets: 298 10*3/uL (ref 150–400)
RBC: 5.1 MIL/uL (ref 3.87–5.11)
RDW: 13.4 % (ref 11.5–15.5)
WBC: 6.7 10*3/uL (ref 4.0–10.5)
nRBC: 0 % (ref 0.0–0.2)

## 2022-08-17 LAB — COMPREHENSIVE METABOLIC PANEL
ALT: 19 U/L (ref 0–44)
AST: 23 U/L (ref 15–41)
Albumin: 4.1 g/dL (ref 3.5–5.0)
Alkaline Phosphatase: 79 U/L (ref 38–126)
Anion gap: 11 (ref 5–15)
BUN: 13 mg/dL (ref 6–20)
CO2: 22 mmol/L (ref 22–32)
Calcium: 9.2 mg/dL (ref 8.9–10.3)
Chloride: 106 mmol/L (ref 98–111)
Creatinine, Ser: 0.67 mg/dL (ref 0.44–1.00)
GFR, Estimated: >60 mL/min (ref >60)
Glucose, Bld: 99 mg/dL (ref 70–99)
Potassium: 3.9 mmol/L (ref 3.5–5.1)
Sodium: 139 mmol/L (ref 135–145)
Total Bilirubin: 0.4 mg/dL (ref 0.3–1.2)
Total Protein: 7.5 g/dL (ref 6.5–8.1)

## 2022-08-17 LAB — HEMOGLOBIN A1C
Hgb A1c MFr Bld: 5.6 % (ref 4.8–5.6)
Mean Plasma Glucose: 114.02 mg/dL

## 2022-08-17 LAB — GLUCOSE, CAPILLARY: Glucose-Capillary: 103 mg/dL — ABNORMAL HIGH (ref 70–99)

## 2022-08-24 NOTE — Anesthesia Preprocedure Evaluation (Signed)
Anesthesia Evaluation  Patient identified by MRN, date of birth, ID band Patient awake    Reviewed: Allergy & Precautions, H&P , NPO status , Patient's Chart, lab work & pertinent test results  Airway Mallampati: II  TM Distance: >3 FB Neck ROM: Full    Dental no notable dental hx.    Pulmonary neg pulmonary ROS   Pulmonary exam normal breath sounds clear to auscultation       Cardiovascular negative cardio ROS Normal cardiovascular exam Rhythm:Regular Rate:Normal     Neuro/Psych negative neurological ROS  negative psych ROS   GI/Hepatic negative GI ROS, Neg liver ROS,,,  Endo/Other    Morbid obesityPCOS  Renal/GU negative Renal ROS  negative genitourinary   Musculoskeletal negative musculoskeletal ROS (+)    Abdominal   Peds negative pediatric ROS (+)  Hematology negative hematology ROS (+)   Anesthesia Other Findings   Reproductive/Obstetrics negative OB ROS                             Anesthesia Physical Anesthesia Plan  ASA: 2  Anesthesia Plan: General   Post-op Pain Management: Tylenol PO (pre-op)*   Induction: Intravenous  PONV Risk Score and Plan: 3 and Ondansetron, Dexamethasone, Midazolam, Scopolamine patch - Pre-op and Treatment may vary due to age or medical condition  Airway Management Planned: Oral ETT  Additional Equipment:   Intra-op Plan:   Post-operative Plan: Extubation in OR  Informed Consent: I have reviewed the patients History and Physical, chart, labs and discussed the procedure including the risks, benefits and alternatives for the proposed anesthesia with the patient or authorized representative who has indicated his/her understanding and acceptance.     Dental advisory given  Plan Discussed with: CRNA and Surgeon  Anesthesia Plan Comments:        Anesthesia Quick Evaluation

## 2022-08-25 ENCOUNTER — Other Ambulatory Visit: Payer: Self-pay

## 2022-08-25 ENCOUNTER — Encounter (HOSPITAL_COMMUNITY)
Admission: RE | Disposition: A | Discharge status: Home / Self Care | Payer: Self-pay | Source: Home / Self Care | Attending: Surgery

## 2022-08-25 ENCOUNTER — Encounter (HOSPITAL_COMMUNITY): Payer: Self-pay | Admitting: Surgery

## 2022-08-25 ENCOUNTER — Inpatient Hospital Stay (HOSPITAL_COMMUNITY): Payer: BC Managed Care – PPO | Admitting: Anesthesiology

## 2022-08-25 ENCOUNTER — Inpatient Hospital Stay (HOSPITAL_COMMUNITY)
Admission: RE | Admit: 2022-08-25 | Discharge: 2022-08-26 | DRG: 621 | Disposition: A | Discharge status: Home / Self Care | Payer: BC Managed Care – PPO | Attending: Surgery | Admitting: Surgery

## 2022-08-25 DIAGNOSIS — R7303 Prediabetes: Secondary | ICD-10-CM | POA: Diagnosis not present

## 2022-08-25 DIAGNOSIS — J45909 Unspecified asthma, uncomplicated: Secondary | ICD-10-CM | POA: Diagnosis not present

## 2022-08-25 DIAGNOSIS — Z833 Family history of diabetes mellitus: Secondary | ICD-10-CM | POA: Diagnosis not present

## 2022-08-25 DIAGNOSIS — E282 Polycystic ovarian syndrome: Secondary | ICD-10-CM | POA: Diagnosis present

## 2022-08-25 DIAGNOSIS — Z6841 Body Mass Index (BMI) 40.0 and over, adult: Secondary | ICD-10-CM | POA: Diagnosis not present

## 2022-08-25 DIAGNOSIS — Z8249 Family history of ischemic heart disease and other diseases of the circulatory system: Secondary | ICD-10-CM | POA: Diagnosis not present

## 2022-08-25 DIAGNOSIS — Z01818 Encounter for other preprocedural examination: Secondary | ICD-10-CM

## 2022-08-25 HISTORY — PX: UPPER GI ENDOSCOPY: SHX6162

## 2022-08-25 LAB — CREATININE, SERUM
Creatinine, Ser: 0.8 mg/dL (ref 0.44–1.00)
GFR, Estimated: >60 mL/min (ref >60)

## 2022-08-25 LAB — CBC
HCT: 41 % (ref 36.0–46.0)
Hemoglobin: 12.5 g/dL (ref 12.0–15.0)
MCH: 26.7 pg (ref 26.0–34.0)
MCHC: 30.5 g/dL (ref 30.0–36.0)
MCV: 87.6 fL (ref 80.0–100.0)
Platelets: 306 10*3/uL (ref 150–400)
RBC: 4.68 MIL/uL (ref 3.87–5.11)
RDW: 13.4 % (ref 11.5–15.5)
WBC: 10.9 10*3/uL — ABNORMAL HIGH (ref 4.0–10.5)
nRBC: 0 % (ref 0.0–0.2)

## 2022-08-25 LAB — HEMOGLOBIN AND HEMATOCRIT, BLOOD
HCT: 40 % (ref 36.0–46.0)
Hemoglobin: 12.9 g/dL (ref 12.0–15.0)

## 2022-08-25 LAB — TYPE AND SCREEN
ABO/RH(D): A POS
Antibody Screen: NEGATIVE

## 2022-08-25 LAB — POCT PREGNANCY, URINE: Preg Test, Ur: NEGATIVE

## 2022-08-25 SURGERY — GASTRECTOMY, SLEEVE, ROBOT-ASSISTED
Anesthesia: General

## 2022-08-25 MED ORDER — ORAL CARE MOUTH RINSE
15.0000 mL | Freq: Once | OROMUCOSAL | Status: AC
  Administered 2022-08-25: 15 mL via OROMUCOSAL

## 2022-08-25 MED ORDER — BUPIVACAINE LIPOSOME 1.3 % IJ SUSP: 20.0000 mL | Freq: Once | INTRAMUSCULAR | Status: DC

## 2022-08-25 MED ORDER — ACETAMINOPHEN 500 MG PO TABS
1000.0000 mg | ORAL_TABLET | Freq: Three times a day (TID) | ORAL | Status: DC
  Administered 2022-08-26 (×2): 1000 mg via ORAL
  Filled 2022-08-25 (×3): qty 2

## 2022-08-25 MED ORDER — PANTOPRAZOLE SODIUM 40 MG PO TBEC: 40.0000 mg | DELAYED_RELEASE_TABLET | Freq: Every day | ORAL | 0 refills | Status: DC

## 2022-08-25 MED ORDER — BUPIVACAINE-EPINEPHRINE (PF) 0.25% -1:200000 IJ SOLN
INTRAMUSCULAR | Status: AC
  Filled 2022-08-25: qty 30

## 2022-08-25 MED ORDER — ONDANSETRON HCL 4 MG/2ML IJ SOLN: 4.0000 mg | Freq: Once | INTRAMUSCULAR | Status: DC | PRN

## 2022-08-25 MED ORDER — FENTANYL CITRATE (PF) 100 MCG/2ML IJ SOLN
INTRAMUSCULAR | Status: AC
  Filled 2022-08-25: qty 2

## 2022-08-25 MED ORDER — LACTATED RINGERS IV SOLN: INTRAVENOUS | Status: DC

## 2022-08-25 MED ORDER — FENTANYL CITRATE PF 50 MCG/ML IJ SOSY
PREFILLED_SYRINGE | INTRAMUSCULAR | Status: AC
  Filled 2022-08-25: qty 2

## 2022-08-25 MED ORDER — PROPOFOL 10 MG/ML IV BOLUS
INTRAVENOUS | Status: DC | PRN
  Administered 2022-08-25: 200 mg via INTRAVENOUS

## 2022-08-25 MED ORDER — PROPOFOL 10 MG/ML IV BOLUS
INTRAVENOUS | Status: AC
  Filled 2022-08-25: qty 20

## 2022-08-25 MED ORDER — ACETAMINOPHEN 500 MG PO TABS
1000.0000 mg | ORAL_TABLET | ORAL | Status: AC
  Administered 2022-08-25: 1000 mg via ORAL
  Filled 2022-08-25: qty 2

## 2022-08-25 MED ORDER — ALBUTEROL SULFATE (2.5 MG/3ML) 0.083% IN NEBU: 2.5000 mg | INHALATION_SOLUTION | RESPIRATORY_TRACT | Status: DC | PRN

## 2022-08-25 MED ORDER — SUGAMMADEX SODIUM 200 MG/2ML IV SOLN
INTRAVENOUS | Status: DC | PRN
  Administered 2022-08-25: 200 mg via INTRAVENOUS

## 2022-08-25 MED ORDER — BUPIVACAINE-EPINEPHRINE (PF) 0.25% -1:200000 IJ SOLN
INTRAMUSCULAR | Status: DC | PRN
  Administered 2022-08-25: 50 mL

## 2022-08-25 MED ORDER — CHLORHEXIDINE GLUCONATE CLOTH 2 % EX PADS: 6.0000 | MEDICATED_PAD | Freq: Once | CUTANEOUS | Status: DC

## 2022-08-25 MED ORDER — ACETAMINOPHEN 160 MG/5ML PO SOLN: 1000.0000 mg | Freq: Three times a day (TID) | ORAL | Status: DC

## 2022-08-25 MED ORDER — LIDOCAINE HCL 2 % IJ SOLN
INTRAMUSCULAR | Status: AC
  Filled 2022-08-25: qty 20

## 2022-08-25 MED ORDER — MIDAZOLAM HCL 2 MG/2ML IJ SOLN
INTRAMUSCULAR | Status: AC
  Filled 2022-08-25: qty 2

## 2022-08-25 MED ORDER — CHLORHEXIDINE GLUCONATE 0.12 % MT SOLN: 15.0000 mL | Freq: Once | OROMUCOSAL | Status: AC

## 2022-08-25 MED ORDER — SIMETHICONE 80 MG PO CHEW: 80.0000 mg | CHEWABLE_TABLET | Freq: Four times a day (QID) | ORAL | Status: DC | PRN

## 2022-08-25 MED ORDER — METHOCARBAMOL 750 MG PO TABS: 750.0000 mg | ORAL_TABLET | Freq: Four times a day (QID) | ORAL | 0 refills | Status: DC

## 2022-08-25 MED ORDER — OXYCODONE HCL 5 MG/5ML PO SOLN
5.0000 mg | Freq: Four times a day (QID) | ORAL | Status: DC | PRN
  Administered 2022-08-26: 5 mg via ORAL
  Filled 2022-08-25: qty 5

## 2022-08-25 MED ORDER — STERILE WATER FOR IRRIGATION IR SOLN
Status: DC | PRN
  Administered 2022-08-25: 1000 mL

## 2022-08-25 MED ORDER — ACETAMINOPHEN 500 MG PO TABS: 1000.0000 mg | ORAL_TABLET | Freq: Three times a day (TID) | ORAL | 0 refills | Status: AC

## 2022-08-25 MED ORDER — ONDANSETRON 4 MG PO TBDP: 4.0000 mg | ORAL_TABLET | Freq: Four times a day (QID) | ORAL | 0 refills | Status: DC | PRN

## 2022-08-25 MED ORDER — DEXAMETHASONE SODIUM PHOSPHATE 10 MG/ML IJ SOLN
INTRAMUSCULAR | Status: AC
  Filled 2022-08-25: qty 1

## 2022-08-25 MED ORDER — PROCHLORPERAZINE EDISYLATE 10 MG/2ML IJ SOLN
10.0000 mg | INTRAMUSCULAR | Status: DC | PRN
  Administered 2022-08-25: 10 mg via INTRAVENOUS
  Filled 2022-08-25: qty 2

## 2022-08-25 MED ORDER — ROCURONIUM BROMIDE 10 MG/ML (PF) SYRINGE
PREFILLED_SYRINGE | INTRAVENOUS | Status: DC | PRN
  Administered 2022-08-25: 80 mg via INTRAVENOUS

## 2022-08-25 MED ORDER — DEXAMETHASONE SODIUM PHOSPHATE 10 MG/ML IJ SOLN
INTRAMUSCULAR | Status: DC | PRN
  Administered 2022-08-25: 10 mg via INTRAVENOUS

## 2022-08-25 MED ORDER — KETOROLAC TROMETHAMINE 30 MG/ML IJ SOLN: 30.0000 mg | Freq: Once | INTRAMUSCULAR | Status: DC | PRN

## 2022-08-25 MED ORDER — EPHEDRINE SULFATE-NACL 50-0.9 MG/10ML-% IV SOSY
PREFILLED_SYRINGE | INTRAVENOUS | Status: DC | PRN
  Administered 2022-08-25 (×2): 5 mg via INTRAVENOUS

## 2022-08-25 MED ORDER — MIDAZOLAM HCL 5 MG/5ML IJ SOLN
INTRAMUSCULAR | Status: DC | PRN
  Administered 2022-08-25: 2 mg via INTRAVENOUS

## 2022-08-25 MED ORDER — OXYCODONE HCL 5 MG/5ML PO SOLN: 5.0000 mg | Freq: Once | ORAL | Status: DC | PRN

## 2022-08-25 MED ORDER — OXYCODONE HCL 5 MG PO TABS: 5.0000 mg | ORAL_TABLET | Freq: Four times a day (QID) | ORAL | 0 refills | Status: DC | PRN

## 2022-08-25 MED ORDER — 0.9 % SODIUM CHLORIDE (POUR BTL) OPTIME
TOPICAL | Status: DC | PRN
  Administered 2022-08-25: 1000 mL

## 2022-08-25 MED ORDER — ENSURE MAX PROTEIN PO LIQD
2.0000 [oz_av] | ORAL | Status: DC
  Administered 2022-08-26 (×2): 2 [oz_av] via ORAL

## 2022-08-25 MED ORDER — FENTANYL CITRATE (PF) 100 MCG/2ML IJ SOLN
INTRAMUSCULAR | Status: DC | PRN
  Administered 2022-08-25: 100 ug via INTRAVENOUS
  Administered 2022-08-25 (×2): 50 ug via INTRAVENOUS

## 2022-08-25 MED ORDER — HEPARIN SODIUM (PORCINE) 5000 UNIT/ML IJ SOLN
5000.0000 [IU] | Freq: Three times a day (TID) | INTRAMUSCULAR | Status: DC
  Administered 2022-08-25 – 2022-08-26 (×3): 5000 [IU] via SUBCUTANEOUS
  Filled 2022-08-25 (×3): qty 1

## 2022-08-25 MED ORDER — ONDANSETRON HCL 4 MG/2ML IJ SOLN
4.0000 mg | INTRAMUSCULAR | Status: DC | PRN
  Administered 2022-08-25 – 2022-08-26 (×3): 4 mg via INTRAVENOUS
  Filled 2022-08-25 (×3): qty 2

## 2022-08-25 MED ORDER — BUPIVACAINE LIPOSOME 1.3 % IJ SUSP
INTRAMUSCULAR | Status: AC
  Filled 2022-08-25: qty 20

## 2022-08-25 MED ORDER — ONDANSETRON HCL 4 MG/2ML IJ SOLN
INTRAMUSCULAR | Status: DC | PRN
  Administered 2022-08-25: 4 mg via INTRAVENOUS

## 2022-08-25 MED ORDER — FENTANYL CITRATE PF 50 MCG/ML IJ SOSY
25.0000 ug | PREFILLED_SYRINGE | INTRAMUSCULAR | Status: DC | PRN
  Administered 2022-08-25 (×2): 50 ug via INTRAVENOUS

## 2022-08-25 MED ORDER — ROCURONIUM BROMIDE 10 MG/ML (PF) SYRINGE
PREFILLED_SYRINGE | INTRAVENOUS | Status: AC
  Filled 2022-08-25: qty 10

## 2022-08-25 MED ORDER — LIDOCAINE 2% (20 MG/ML) 5 ML SYRINGE
INTRAMUSCULAR | Status: DC | PRN
  Administered 2022-08-25: 100 mg via INTRAVENOUS

## 2022-08-25 MED ORDER — PANTOPRAZOLE SODIUM 40 MG IV SOLR
40.0000 mg | Freq: Every day | INTRAVENOUS | Status: DC
  Administered 2022-08-25: 40 mg via INTRAVENOUS
  Filled 2022-08-25: qty 10

## 2022-08-25 MED ORDER — MORPHINE SULFATE (PF) 2 MG/ML IV SOLN: 1.0000 mg | INTRAVENOUS | Status: DC | PRN

## 2022-08-25 MED ORDER — DEXMEDETOMIDINE HCL IN NACL 80 MCG/20ML IV SOLN
INTRAVENOUS | Status: DC | PRN
  Administered 2022-08-25: 8 ug via BUCCAL

## 2022-08-25 MED ORDER — METOCLOPRAMIDE HCL 5 MG/ML IJ SOLN
10.0000 mg | Freq: Four times a day (QID) | INTRAMUSCULAR | Status: DC
  Administered 2022-08-25 – 2022-08-26 (×4): 10 mg via INTRAVENOUS
  Filled 2022-08-25 (×4): qty 2

## 2022-08-25 MED ORDER — LIDOCAINE HCL (PF) 2 % IJ SOLN
INTRAMUSCULAR | Status: DC | PRN
  Administered 2022-08-25: 1 mg/kg/h

## 2022-08-25 MED ORDER — APREPITANT 40 MG PO CAPS
40.0000 mg | ORAL_CAPSULE | ORAL | Status: AC
  Administered 2022-08-25: 40 mg via ORAL
  Filled 2022-08-25: qty 1

## 2022-08-25 MED ORDER — HEPARIN SODIUM (PORCINE) 5000 UNIT/ML IJ SOLN
5000.0000 [IU] | INTRAMUSCULAR | Status: AC
  Administered 2022-08-25: 5000 [IU] via SUBCUTANEOUS
  Filled 2022-08-25: qty 1

## 2022-08-25 MED ORDER — OXYCODONE HCL 5 MG PO TABS: 5.0000 mg | ORAL_TABLET | Freq: Once | ORAL | Status: DC | PRN

## 2022-08-25 MED ORDER — ONDANSETRON HCL 4 MG/2ML IJ SOLN
INTRAMUSCULAR | Status: AC
  Filled 2022-08-25: qty 2

## 2022-08-25 MED ORDER — SODIUM CHLORIDE 0.9 % IV SOLN
2.0000 g | INTRAVENOUS | Status: AC
  Administered 2022-08-25: 2 g via INTRAVENOUS
  Filled 2022-08-25: qty 2

## 2022-08-25 SURGICAL SUPPLY — 80 items
ADH SKN CLS APL DERMABOND .7 (GAUZE/BANDAGES/DRESSINGS) ×1
ANTIFOG SOL W/FOAM PAD STRL (MISCELLANEOUS) ×1
APL PRP STRL LF DISP 70% ISPRP (MISCELLANEOUS) ×2
APPLIER CLIP 5 13 M/L LIGAMAX5 (MISCELLANEOUS)
APPLIER CLIP ROT 10 11.4 M/L (STAPLE)
APR CLP MED LRG 11.4X10 (STAPLE)
APR CLP MED LRG 5 ANG JAW (MISCELLANEOUS)
BAG COUNTER SPONGE SURGICOUNT (BAG) ×2 IMPLANT
BAG SPNG CNTER NS LX DISP (BAG) ×1
BLADE SURG SZ11 CARB STEEL (BLADE) ×2 IMPLANT
CANNULA REDUC XI 12-8 STAPL (CANNULA) ×1
CANNULA REDUCER 12-8 DVNC XI (CANNULA) ×2 IMPLANT
CHLORAPREP W/TINT 26 (MISCELLANEOUS) ×4 IMPLANT
CLIP APPLIE 5 13 M/L LIGAMAX5 (MISCELLANEOUS) IMPLANT
CLIP APPLIE ROT 10 11.4 M/L (STAPLE) IMPLANT
COVER SURGICAL LIGHT HANDLE (MISCELLANEOUS) ×2 IMPLANT
COVER TIP SHEARS 8 DVNC (MISCELLANEOUS) IMPLANT
COVER TIP SHEARS 8MM DA VINCI (MISCELLANEOUS)
DERMABOND ADVANCED .7 DNX12 (GAUZE/BANDAGES/DRESSINGS) ×2 IMPLANT
DRAPE ARM DVNC X/XI (DISPOSABLE) ×8 IMPLANT
DRAPE COLUMN DVNC XI (DISPOSABLE) ×2 IMPLANT
DRAPE DA VINCI XI ARM (DISPOSABLE) ×4
DRAPE DA VINCI XI COLUMN (DISPOSABLE) ×1
ELECT REM PT RETURN 15FT ADLT (MISCELLANEOUS) ×2 IMPLANT
GAUZE 4X4 16PLY ~~LOC~~+RFID DBL (SPONGE) ×2 IMPLANT
GLOVE BIO SURGEON STRL SZ7.5 (GLOVE) ×4 IMPLANT
GLOVE INDICATOR 8.0 STRL GRN (GLOVE) ×4 IMPLANT
GOWN STRL REUS W/ TWL XL LVL3 (GOWN DISPOSABLE) ×4 IMPLANT
GOWN STRL REUS W/TWL XL LVL3 (GOWN DISPOSABLE) ×2
GRASPER SUT TROCAR 14GX15 (MISCELLANEOUS) ×2 IMPLANT
HEMOSTAT SNOW SURGICEL 2X4 (HEMOSTASIS) IMPLANT
IRRIG SUCT STRYKERFLOW 2 WTIP (MISCELLANEOUS) ×1
IRRIGATION SUCT STRKRFLW 2 WTP (MISCELLANEOUS) ×2 IMPLANT
IRRIGATOR SUCT 8 DISP DVNC XI (IRRIGATION / IRRIGATOR) IMPLANT
IRRIGATOR SUCTION 8MM XI DISP (IRRIGATION / IRRIGATOR)
KIT BASIN OR (CUSTOM PROCEDURE TRAY) ×2 IMPLANT
KIT TURNOVER KIT A (KITS) IMPLANT
LUBRICANT JELLY K Y 4OZ (MISCELLANEOUS) IMPLANT
MARKER SKIN DUAL TIP RULER LAB (MISCELLANEOUS) IMPLANT
MAT PREVALON FULL STRYKER (MISCELLANEOUS) ×2 IMPLANT
NDL SPNL 18GX3.5 QUINCKE PK (NEEDLE) ×2 IMPLANT
NEEDLE SPNL 18GX3.5 QUINCKE PK (NEEDLE) ×1 IMPLANT
OBTURATOR OPTICAL STANDARD 8MM (TROCAR) ×1
OBTURATOR OPTICAL STND 8 DVNC (TROCAR) ×1
OBTURATOR OPTICALSTD 8 DVNC (TROCAR) ×2 IMPLANT
PACK CARDIOVASCULAR III (CUSTOM PROCEDURE TRAY) ×2 IMPLANT
RELOAD STAPLE 60 2.5 WHT DVNC (STAPLE) IMPLANT
RELOAD STAPLE 60 3.5 BLU DVNC (STAPLE) IMPLANT
RELOAD STAPLER 2.5X60 WHT DVNC (STAPLE) ×4 IMPLANT
RELOAD STAPLER 3.5X60 BLU DVNC (STAPLE) ×2 IMPLANT
SCISSORS LAP 5X35 DISP (ENDOMECHANICALS) IMPLANT
SEAL CANN UNIV 5-8 DVNC XI (MISCELLANEOUS) ×6 IMPLANT
SEAL XI 5MM-8MM UNIVERSAL (MISCELLANEOUS) ×3
SEALER VESSEL DA VINCI XI (MISCELLANEOUS) ×1
SEALER VESSEL EXT DVNC XI (MISCELLANEOUS) ×2 IMPLANT
SET TUBE SMOKE EVAC HIGH FLOW (TUBING) ×2 IMPLANT
SLEEVE GASTRECTOMY 40FR VISIGI (MISCELLANEOUS) IMPLANT
SOL ELECTROSURG ANTI STICK (MISCELLANEOUS) ×1
SOLUTION ANTFG W/FOAM PAD STRL (MISCELLANEOUS) ×2 IMPLANT
SOLUTION ELECTROSURG ANTI STCK (MISCELLANEOUS) ×2 IMPLANT
SPIKE FLUID TRANSFER (MISCELLANEOUS) ×2 IMPLANT
STAPLER 60 DA VINCI SURE FORM (STAPLE) ×1
STAPLER 60 SUREFORM DVNC (STAPLE) ×2 IMPLANT
STAPLER CANNULA SEAL DVNC XI (STAPLE) ×2 IMPLANT
STAPLER CANNULA SEAL XI (STAPLE) ×1
STAPLER RELOAD 2.5X60 WHITE (STAPLE) ×4
STAPLER RELOAD 2.5X60 WHT DVNC (STAPLE) ×4
STAPLER RELOAD 3.5X60 BLU DVNC (STAPLE) ×2
STAPLER RELOAD 3.5X60 BLUE (STAPLE) ×2
SUT MNCRL AB 4-0 PS2 18 (SUTURE) ×4 IMPLANT
SUT VIC AB 0 CT1 27 (SUTURE) ×1
SUT VIC AB 0 CT1 27XBRD ANTBC (SUTURE) ×2 IMPLANT
SUT VIC AB 2-0 SH 27 (SUTURE) ×1
SUT VIC AB 2-0 SH 27XBRD (SUTURE) ×2 IMPLANT
SYR 20ML LL LF (SYRINGE) ×2 IMPLANT
TOWEL OR 17X26 10 PK STRL BLUE (TOWEL DISPOSABLE) ×2 IMPLANT
TRAY FOLEY MTR SLVR 16FR STAT (SET/KITS/TRAYS/PACK) IMPLANT
TROCAR ADV FIXATION 12X100MM (TROCAR) IMPLANT
TROCAR Z-THREAD FIOS 5X100MM (TROCAR) ×2 IMPLANT
TUBE CALIBRATION LAPBAND (TUBING) IMPLANT

## 2022-08-25 NOTE — Progress Notes (Signed)
Discussed QI "Goals for Discharge" document with patient including ambulation in halls, Incentive Spirometry use every hour, and oral care.  Also discussed pain and nausea control.  Enabled or verified head of bed 30 degree alarm activated.  BSTOP education provided including BSTOP information guide, "Guide for Pain Management after your Bariatric Procedure".  Diet progression education provided including "Bariatric Surgery Post-Op Food Plan Phase 1: Liquids".  Questions answered.  Will continue to partner with bedside RN and follow up with patient per protocol.   Bedside RN communicated pt nausea episodes and this RN went in to round on pt. Noticed pt with emesis bag, and feeling nauseas; this RN repositioned pt to 32 degrees in bed, and communicated to surgeon pt status. Acknowledged by MD and new orders received. Pt primary RN updated as well. Pt sleeping now. Will continue to assess. .    Thank you,  Calton Dach, RN, MSN Bariatric Nurse Coordinator [phone] (office)

## 2022-08-25 NOTE — Op Note (Signed)
Patient: Sarah Hutchinson (09-19-1990, [id number])  Date of Surgery: 08/25/2022   Preoperative Diagnosis: MORBID OBESITY (BMI 40.48), prediabetes, PCOS  Postoperative Diagnosis: MORBID OBESITY (BMI 40.48), prediabetes, PCOS  Surgical Procedure: ROBOTIC SLEEVE GASTRECTOMY: [id number] (CPT) UPPER GI ENDOSCOPY: [id number]   Operative Team Members:  Surgeon(s) and Role:    * Donelda Mailhot, Nickola Major, MD - Primary   [place]  Anesthesiologist: Myrtie Soman, MD CRNA: Gerald Leitz, CRNA; Maxwell Caul, CRNA   Anesthesia: General   Fluids:  Total I/O In: 900 [I.V.:800; IV Piggyback:100] Out: 10 [id number]  Complications: None  Drains:  none   Specimen:  ID Type Source Tests Collected by Time Destination  1 : Stomach Tissue PATH GI benign resection SURGICAL PATHOLOGY Dashanti Burr, Nickola Major, MD 08/25/2022 [phone]      Disposition:  PACU - hemodynamically stable.  Plan of Care: Admit to inpatient     Indications for Procedure:  Sarah Hutchinson is a 32 y.o. female who is seen for bariatric surgery consultation. The patient has morbid obesity with a BMI of Body mass index is 40.48 kg/m. and the following conditions related to obesity: prediabetes, PCOS.  Today we discussed the surgical options to treat obesity and its associated comorbidity. After discussing the available procedures in the region, we discussed in great detail the surgeries I offer: robotic sleeve gastrectomy and robotic roux-en-y gastric bypass. We discussed the procedures themselves as well as their risks, benefits and alternatives. I entered the patient's basic information into the [facility] Metabolic Surgery Risk/Benefit Calculator to facilitate this discussion.   After a full discussion and all questions answered, the patient is interested in pursuing a robotic sleeve gastrectomy with upper endoscopy  She completed the bariatric surgery preoperative pathway: - Bloodwork - Dietician consult - Chest  x-ray - EKG - Psychology evaluation - Upper endoscopy with biopsy. 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  We discussed the procedure, its risks, benefits and alteratives.  After a full discussion and all questions answered the patient granted consent to proceed.  We will proceed as scheduled.  Findings: normal anatomy  Infection status: Patient: Private Patient Elective Case Case: Elective Infection Present At Time Of Surgery (PATOS): None   Description of Procedure:   On the date stated above, the patient was taken to the operating room suite and placed in supine positioning.  General endotracheal anesthesia was induced.  A timeout was completed verifying the correct patient, procedure, positioning and equipment needed for the case.  The patient's abdomen was prepped and draped in the usual sterile fashion.  I entered the patient's right upper quadrant using a 5 mm trocar in the optical technique.  There was no trauma to underlying viscera with initial trocar placement.  The abdomen was insufflated 15 mmHg.  A total of 4 robotic trochars were placed across the mid abdomen, including the 5 mm initial trocar being upsized to a 8 mm trocar.  The robotic stapler trocar was placed in the number two position.  The [facility] liver retractor was placed through the subxiphoid region and under the left lobe of the liver and was connected to the rail of the bed.  A TAP block was placed using marcaine and Exparel under direct vision of the laparoscope.  The [place] was docked and we transitioned to robotic surgery.   Using the tip up fenestrated grasper, fenestrated bipolar,  30 degree camera and Vessel Sealer from the patient's right to left, we began by dissecting the angle of His off the left crus of the diaphragm.  The adhesions between the stomach, spleen and diaphragm were divided  using the Vessel Sealer to define the angle of His.    I then started 6 cm away from the pylorus along the greater curve the stomach and divided the gastroepiploic vessels and the gastrocolic ligament.  The lesser sac was entered.  There were really no adhesions to the posterior wall of the stomach.  The greater curve was mobilized working superiorly toward the spleen.  All of the gastroepiploic and short gastric vessels were divided as we divided the gastrocolic and gastrosplenic ligaments.  As we reached the splenic hilum, I lifted the stomach anteriorly.  I created a tunnel between the stomach and its attachments to the retroperitoneum posteriorly just to the left of the GE junction until I encountered the left crus and my previous angle of His dissection.  We then were able to approach the shortest of the short gastrics both from the greater curve the stomach laterally and from the left crus medially.  These were divided using the Vessel Sealer and the fundus of the stomach was fully mobilized.  With the stomach fully mobilized we direct our attention to stapling.  A 40 French VISI G was inserted into the stomach and positioned along the lesser curve the stomach and suction was applied.  The 60 mm robotic sureform linear stapler was used to create the sleeve gastrectomy.  We started 6 cm from the pylorus and were careful to avoid narrowing at the incisura.  We stayed about 1 cm away from the GE junction to protect the sling fibers.  We used two blue and multiple white loads of the linear stapler.  With the sleeve gastrectomy completed the VISI G was taken off suction and removed and we performed an upper endoscopy.  The intra-abdominal pressure was decreased to 62mHg to check hemostasis.  The foregut was submerged in saline irrigation and the adult upper endoscope was inserted into the stomach as far as the pylorus to inspect the sleeve.  The sleeve appeared appropriately oriented without any twisting.  There  was good hemostasis.  The sleeve was widely patent at the incisura with no narrowing.  There was no significant retained fundus.  The sleeve was inflated with the endoscope and there was no bubbling of the irrigation, suggesting a negative leak test and a airtight sleeve gastrectomy.  The foregut was decompressed with the endoscope and the endoscope was removed.  An omentopexy was performed, using running 2-0 vicryl suture to connect the inferior two staple lines to the divided omentum. There was good hemostasis at the end of the case.  The robot was undocked and moved away from the field.  The sleeve gastrectomy specimen was removed from the stapler port.  The fascia of the stapler port was closed using a 0 Vicryl on a PMI suture passer.   The liver retractor was removed under direct vision.  The pneumoperitoneum was evacuated.  The skin was closed using 4-0 Monocryl and Dermabond.  All sponge and needle counts were correct at the end of the case.    PLouanna Raw MD General, Bariatric, & Minimally Invasive Surgery [facility], [place]

## 2022-08-25 NOTE — H&P (Signed)
Admitting Physician: Nickola Major Sami Roes  Service: Bariatric surgery  CC: obesity  Subjective   HPI: Sarah Hutchinson is an 32 y.o. female who is here for sleeve gastrectomy  Past Medical History:  Diagnosis Date   Anxiety 04/2021   Every since being diagnosed with PCOS ive had anxiety   Asthma    Asthma    Family history of adverse reaction to anesthesia    grandmother went under and never woke up   HSV-2 infection    PCOS (polycystic ovarian syndrome)     Past Surgical History:  Procedure Laterality Date   BIOPSY  06/19/2022   Procedure: BIOPSY;  Surgeon: Felicie Morn, MD;  Location: [facility] ENDOSCOPY;  Service: General;;   ESOPHAGOGASTRODUODENOSCOPY N/A 06/19/2022   Procedure: ESOPHAGOGASTRODUODENOSCOPY (EGD);  Surgeon: Felicie Morn, MD;  Location: [facility];  Service: General;  Laterality: N/A;   FOOT SURGERY Right    FRACTURE SURGERY  08/2014   Planters plate reconstructive (R) foot    Family History  Problem Relation Age of Onset   Diabetes Mother    Hypertension Mother    Obesity Mother    Diabetes Father    Obesity Father    Diabetes Paternal Grandfather    Diabetes Paternal Grandmother    Diabetes Brother    Diabetes Paternal Aunt    Diabetes Paternal Uncle    Obesity Brother    Obesity Paternal Aunt     Social:  reports that she has never smoked. She has never used smokeless tobacco. She reports that she does not currently use alcohol. She reports that she does not use drugs.  Allergies:  Allergies  Allergen Reactions   Tape Swelling    Medications: Current Outpatient Medications  Medication Instructions   acetaminophen (TYLENOL) 650 mg, Oral, Every 6 hours PRN   albuterol (VENTOLIN HFA) 108 (90 Base) MCG/ACT inhaler INHALE 2 INHALATIONS INTO THE LUNGS EVERY 6 HOURS AS NEEDED FOR WHEEZING   norgestimate-ethinyl estradiol (SPRINTEC 28) 0.25-35 MG-MCG tablet 1 tablet, Oral, Daily   pantoprazole (PROTONIX) 40 mg, Oral, Daily     ROS - all of the below systems have been reviewed with the patient and positives are indicated with bold text General: chills, fever or night sweats Eyes: blurry vision or double vision ENT: epistaxis or sore throat Allergy/Immunology: itchy/watery eyes or nasal congestion Hematologic/Lymphatic: bleeding problems, blood clots or swollen lymph nodes Endocrine: temperature intolerance or unexpected weight changes Breast: new or changing breast lumps or nipple discharge Resp: cough, shortness of breath, or wheezing CV: chest pain or dyspnea on exertion GI: as per HPI GU: dysuria, trouble voiding, or hematuria MSK: joint pain or joint stiffness Neuro: TIA or stroke symptoms Derm: pruritus and skin lesion changes Psych: anxiety and depression  Objective   PE Blood pressure 126/78, pulse 93, temperature 99.2 F (37.3 C), temperature source Oral, resp. rate 16, height 5' 7.5" (1.715 m), weight 119 kg, last menstrual period 08/23/2022, SpO2 99 %, not currently breastfeeding. Constitutional: NAD; conversant; no deformities Eyes: Moist conjunctiva; no lid lag; anicteric; PERRL Neck: Trachea midline; no thyromegaly Lungs: Normal respiratory effort; no tactile fremitus CV: RRR; no palpable thrills; no pitting edema GI: Abd Soft, nontender; no palpable hepatosplenomegaly MSK: Normal range of motion of extremities; no clubbing/cyanosis Psychiatric: Appropriate affect; alert and oriented x3 Lymphatic: No palpable cervical or axillary lymphadenopathy  Results for orders placed or performed during the hospital encounter of 08/25/22 (from the past 24 hour(s))  Pregnancy, urine POC  Status: None   Collection Time: 08/25/22  6:24 AM  Result Value Ref Range   Preg Test, Ur NEGATIVE NEGATIVE    Imaging Orders  No imaging studies ordered today     Assessment and Plan  Morbid obesity with body mass index (BMI) of 40.0 to 44.9 in adult (CMS-HCC)  PCOS (polycystic ovarian  syndrome)  Prediabetes  Preoperative testing  Sarah Hutchinson is a 32 y.o. female who is seen for bariatric surgery consultation. The patient has morbid obesity with a BMI of Body mass index is 40.48 kg/m. and the following conditions related to obesity: prediabetes, PCOS.  Today we discussed the surgical options to treat obesity and its associated comorbidity. After discussing the available procedures in the region, we discussed in great detail the surgeries I offer: robotic sleeve gastrectomy and robotic roux-en-y gastric bypass. We discussed the procedures themselves as well as their risks, benefits and alternatives. I entered the patient's basic information into the [facility] Metabolic Surgery Risk/Benefit Calculator to facilitate this discussion.   After a full discussion and all questions answered, the patient is interested in pursuing a robotic sleeve gastrectomy with upper endoscopy  She completed the bariatric surgery preoperative pathway: - Bloodwork - Dietician consult - Chest x-ray - EKG - Psychology evaluation - Upper endoscopy with biopsy. 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  We discussed the procedure, its risks, benefits and alteratives.  After a full discussion and all questions answered the patient granted consent to proceed.  We will proceed as scheduled.     Felicie Morn, MD  [facility] Use [email or web address] to contact on call provider

## 2022-08-25 NOTE — Transfer of Care (Signed)
Immediate Anesthesia Transfer of Care Note  Patient: Sarah Hutchinson  Procedure(s) Performed: Procedure(s): ROBOTIC SLEEVE GASTRECTOMY (N/A) UPPER GI ENDOSCOPY (N/A)  Patient Location: PACU  Anesthesia Type:General  Level of Consciousness: Alert, Awake, Oriented  Airway & Oxygen Therapy: Patient Spontanous Breathing  Post-op Assessment: Report given to RN  Post vital signs: Reviewed and stable  Last Vitals:  Vitals:   08/25/22 0606  BP: 126/78  Pulse: 93  Resp: 16  Temp: 37.3 C  SpO2: 123456    Complications: No apparent anesthesia complications

## 2022-08-25 NOTE — Progress Notes (Signed)
PHARMACY CONSULT FOR:  Risk Assessment for Post-Discharge VTE Following Bariatric Surgery  Post-Discharge VTE Risk Assessment: This patient's probability of 30-day post-discharge VTE is increased due to the factors marked: x Sleeve gastrectomy   Liver disorder (transplant, cirrhosis, or nonalcoholic steatohepatitis)   Hx of VTE   Hemorrhage requiring transfusion   GI perforation, leak, or obstruction   ====================================================    Female    Age >/=60 years    BMI >/=50 kg/m2    CHF    Dyspnea at Rest    Paraplegia   x Non-gastric-band surgery    Operation Time >/=3 hr    Return to OR     Length of Stay >/= 3 d   Hypercoagulable condition   Significant venous stasis      Predicted probability of 30-day post-discharge VTE: 0.16%  Other patient-specific factors to consider: N/A   Recommendation for Discharge: No pharmacologic prophylaxis post-discharge      Sarah Hutchinson is a 32 y.o. female who underwent robotic sleeve gastrectomy on 08/25/2022.   Case start: 0752 Case end: 0859   Allergies  Allergen Reactions   Tape Swelling    Patient Measurements: Height: 5' 7.5" (171.5 cm) Weight: 119 kg (262 lb 5.6 oz) IBW/kg (Calculated) : 62.75 Body mass index is 40.48 kg/Hutchinson.  Recent Labs    08/25/22 1039  WBC 10.9*  HGB 12.5  HCT 41.0  PLT 306   Estimated Creatinine Clearance: 139.7 mL/min (by C-G formula based on SCr of 0.67 mg/dL).    Past Medical History:  Diagnosis Date   Anxiety 04/2021   Every since being diagnosed with PCOS ive had anxiety   Asthma    Asthma    Family history of adverse reaction to anesthesia    grandmother went under and never woke up   HSV-2 infection    PCOS (polycystic ovarian syndrome)      Medications Prior to Admission  Medication Sig Dispense Refill Last Dose   albuterol (VENTOLIN HFA) 108 (90 Base) MCG/ACT inhaler INHALE 2 INHALATIONS INTO THE LUNGS EVERY 6 HOURS AS NEEDED FOR WHEEZING 25.5  g 1 Past Week   pantoprazole (PROTONIX) 40 MG tablet Take 1 tablet (40 mg total) by mouth daily. 90 tablet 3 08/24/2022   acetaminophen (TYLENOL) 325 MG tablet Take 650 mg by mouth every 6 (six) hours as needed for moderate pain.   More than a month   norgestimate-ethinyl estradiol (SPRINTEC 28) 0.25-35 MG-MCG tablet Take 1 tablet by mouth daily.   More than a month       Sarah Hutchinson 08/25/2022,10:51 AM

## 2022-08-25 NOTE — Anesthesia Procedure Notes (Signed)
Procedure Name: Intubation Date/Time: 08/25/2022 7:33 AM  Performed by: Gerald Leitz, CRNAPre-anesthesia Checklist: Patient identified, Patient being monitored, Timeout performed, Emergency Drugs available and Suction available Patient Re-evaluated:Patient Re-evaluated prior to induction Oxygen Delivery Method: Circle system utilized Preoxygenation: Pre-oxygenation with 100% oxygen Induction Type: IV induction Ventilation: Mask ventilation without difficulty Laryngoscope Size: Mac and 3 Grade View: Grade I Tube type: Oral Tube size: 7.5 mm Number of attempts: 1 Airway Equipment and Method: Stylet Placement Confirmation: ETT inserted through vocal cords under direct vision, positive ETCO2 and breath sounds checked- equal and bilateral Secured at: 21 cm Tube secured with: Tape Dental Injury: Teeth and Oropharynx as per pre-operative assessment

## 2022-08-26 ENCOUNTER — Encounter (HOSPITAL_COMMUNITY): Payer: Self-pay | Admitting: Surgery

## 2022-08-26 LAB — CBC WITH DIFFERENTIAL/PLATELET
Abs Immature Granulocytes: 0.04 10*3/uL (ref 0.00–0.07)
Basophils Absolute: 0 10*3/uL (ref 0.0–0.1)
Basophils Relative: 0 %
Eosinophils Absolute: 0 10*3/uL (ref 0.0–0.5)
Eosinophils Relative: 0 %
HCT: 38.3 % (ref 36.0–46.0)
Hemoglobin: 12 g/dL (ref 12.0–15.0)
Immature Granulocytes: 0 %
Lymphocytes Relative: 13 %
Lymphs Abs: 1.4 10*3/uL (ref 0.7–4.0)
MCH: 27 pg (ref 26.0–34.0)
MCHC: 31.3 g/dL (ref 30.0–36.0)
MCV: 86.3 fL (ref 80.0–100.0)
Monocytes Absolute: 0.5 10*3/uL (ref 0.1–1.0)
Monocytes Relative: 5 %
Neutro Abs: 8.5 10*3/uL — ABNORMAL HIGH (ref 1.7–7.7)
Neutrophils Relative %: 82 %
Platelets: 329 10*3/uL (ref 150–400)
RBC: 4.44 MIL/uL (ref 3.87–5.11)
RDW: 13.6 % (ref 11.5–15.5)
WBC: 10.4 10*3/uL (ref 4.0–10.5)
nRBC: 0 % (ref 0.0–0.2)

## 2022-08-26 LAB — SURGICAL PATHOLOGY

## 2022-08-26 NOTE — Progress Notes (Signed)
Doing well POD1.  If meeting goals later today, will plan for discharge.

## 2022-08-26 NOTE — Discharge Instructions (Signed)

## 2022-08-26 NOTE — Progress Notes (Signed)
Assessment unchanged. Pt verbalized understanding of dc instructions including medications to resume and follow up care as well as when to call the MD. Tolerating shakes. Discharged via wc to front entrance accompanied by husband and NT.

## 2022-08-26 NOTE — Progress Notes (Signed)
Patient alert and oriented, pain is controlled. Patient is tolerating fluids, advancing to protein shake today, patient is tolerating well. Reviewed Gastric sleeve discharge instructions with patient and patient is able to articulate understanding. Provided information on BELT program, Support Group and [facility] outpatient pharmacy. Communicated general update of patient status to surgeon. All questions answered. 24hr fluid recall is currently 360 mL per hydration protocol, bariatric nurse coordinator to make follow-up phone call within one week.    Thank you,  Calton Dach, RN, MSN Bariatric Nurse Coordinator [phone] (office)

## 2022-08-26 NOTE — Plan of Care (Signed)
  Problem: Education: Goal: Ability to state signs and symptoms to report to health care provider will improve Outcome: Adequate for Discharge Goal: Knowledge of the prescribed self-care regimen will improve Outcome: Adequate for Discharge Goal: Knowledge of discharge needs will improve Outcome: Adequate for Discharge   Problem: Activity: Goal: Ability to tolerate increased activity will improve Outcome: Adequate for Discharge   Problem: Bowel/Gastric: Goal: Gastrointestinal status for postoperative course will improve Outcome: Adequate for Discharge Goal: Occurrences of nausea will decrease Outcome: Adequate for Discharge   Problem: Coping: Goal: Development of coping mechanisms to deal with changes in body function or appearance will improve Outcome: Adequate for Discharge   Problem: Fluid Volume: Goal: Maintenance of adequate hydration will improve Outcome: Adequate for Discharge   Problem: Nutritional: Goal: Nutritional status will improve Outcome: Adequate for Discharge   Problem: Clinical Measurements: Goal: Will show no signs or symptoms of venous thromboembolism Outcome: Adequate for Discharge Goal: Will remain free from infection Outcome: Adequate for Discharge Goal: Will show no signs of GI Leak Outcome: Adequate for Discharge   Problem: Respiratory: Goal: Will regain and/or maintain adequate ventilation Outcome: Adequate for Discharge   Problem: Pain Management: Goal: Pain level will decrease Outcome: Adequate for Discharge   Problem: Skin Integrity: Goal: Demonstration of wound healing without infection will improve Outcome: Adequate for Discharge   Problem: Education: Goal: Knowledge of General Education information will improve Description: Including pain rating scale, medication(s)/side effects and non-pharmacologic comfort measures Outcome: Adequate for Discharge   Problem: Health Behavior/Discharge Planning: Goal: Ability to manage  health-related needs will improve Outcome: Adequate for Discharge   Problem: Clinical Measurements: Goal: Ability to maintain clinical measurements within normal limits will improve Outcome: Adequate for Discharge Goal: Will remain free from infection Outcome: Adequate for Discharge Goal: Diagnostic test results will improve Outcome: Adequate for Discharge Goal: Respiratory complications will improve Outcome: Adequate for Discharge Goal: Cardiovascular complication will be avoided Outcome: Adequate for Discharge   Problem: Activity: Goal: Risk for activity intolerance will decrease Outcome: Adequate for Discharge   Problem: Nutrition: Goal: Adequate nutrition will be maintained Outcome: Adequate for Discharge   Problem: Coping: Goal: Level of anxiety will decrease Outcome: Adequate for Discharge   Problem: Elimination: Goal: Will not experience complications related to bowel motility Outcome: Adequate for Discharge Goal: Will not experience complications related to urinary retention Outcome: Adequate for Discharge   Problem: Pain Managment: Goal: General experience of comfort will improve Outcome: Adequate for Discharge   Problem: Safety: Goal: Ability to remain free from injury will improve Outcome: Adequate for Discharge   Problem: Skin Integrity: Goal: Risk for impaired skin integrity will decrease Outcome: Adequate for Discharge

## 2022-08-26 NOTE — TOC CM/SW Note (Signed)
Transition of Care [facility]) Screening Note  Patient Details  Name: Sarah Hutchinson Date of Birth: [birth year]  Transition of Care [facility]) CM/SW Contact:    Sherie Don, LCSW Phone Number: 08/26/2022, 8:32 AM  [facility] has [facility] patient and no TOC needs have been identified at this time. We will continue to monitor patient advancement through interdisciplinary progression rounds. If new patient transition needs arise, please place a TOC consult.

## 2022-08-27 ENCOUNTER — Telehealth: Payer: Self-pay

## 2022-08-27 NOTE — Anesthesia Postprocedure Evaluation (Signed)
Anesthesia Post Note  Patient: Sarah Hutchinson  Procedure(s) Performed: ROBOTIC SLEEVE GASTRECTOMY UPPER GI ENDOSCOPY     Patient location during evaluation: PACU Anesthesia Type: General Level of consciousness: awake and alert Pain management: pain level controlled Vital Signs Assessment: post-procedure vital signs reviewed and stable Respiratory status: spontaneous breathing, nonlabored ventilation, respiratory function stable and patient connected to nasal cannula oxygen Cardiovascular status: blood pressure returned to baseline and stable Postop Assessment: no apparent nausea or vomiting Anesthetic complications: no  No notable events documented.  Last Vitals:  Vitals:   08/26/22 1014 08/26/22 1350  BP: 123/71 137/81  Pulse: (!) 107 (!) 103  Resp: 14 14  Temp: 36.6 C 36.5 C  SpO2: 94% 97%    Last Pain:  Vitals:   08/26/22 1350  TempSrc: Oral  PainSc:                  Torrie Lafavor S

## 2022-08-27 NOTE — Transitions of Care (Post Inpatient/ED Visit) (Signed)
   08/27/2022  Name: Sarah Hutchinson MRN: [id number] DOB: 02/06/1991  Today's TOC FU Call Status: Today's TOC FU Call Status:: Unsuccessul Call (1st Attempt) Unsuccessful Call (1st Attempt) Date: 08/27/22  Attempted to reach the patient regarding the most recent Inpatient/ED visit.  Follow Up Plan: Additional outreach attempts will be made to reach the patient to complete the Transitions of Care (Post Inpatient/ED visit) call.   Signature Juanda Crumble, [place] Direct Dial [phone]

## 2022-08-27 NOTE — Discharge Summary (Signed)
Patient ID: Sarah Hutchinson [id number] 31 y.o. March 05, 1991  08/25/2022  Discharge date and time: 08/26/22  Admitting Physician: Wilsey  Discharge Physician: Haring  Admission Diagnoses: Morbid obesity with BMI of 40.0-44.9, adult ([place]) [E66.01, Z68.41] Patient Active Problem List   Diagnosis Date Noted   Morbid obesity with BMI of 40.0-44.9, adult ([place]) 08/25/2022   HSV-2 (herpes simplex virus 2) infection 01/29/2022   PCOS (polycystic ovarian syndrome) 01/29/2022   Obesity (BMI 30-39.9) 01/29/2022   Prediabetes 01/29/2022   Anxiety 01/29/2022   Postpartum care following vaginal delivery 06/16/2015   Left wrist injury 12/04/2013     Discharge Diagnoses:  Patient Active Problem List   Diagnosis Date Noted   Morbid obesity with BMI of 40.0-44.9, adult ([place]) 08/25/2022   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  Consults: None  Significant Diagnostic Studies: None  Treatments: surgery: as above  Disposition: Home  Patient Instructions:  Allergies as of 08/26/2022       Reactions   Tape Swelling        Medication List     TAKE these medications    acetaminophen 325 MG tablet Commonly known as: TYLENOL Take 650 mg by mouth every 6 (six) hours as needed for moderate pain. What changed: Another medication with the same name was added. Make sure you understand how and when to take each.   acetaminophen 500 MG tablet Commonly known as:  TYLENOL Take 2 tablets (1,000 mg total) by mouth every 8 (eight) hours for 5 days. What changed: You were already taking a medication with the same name, and this prescription was added. Make sure you understand how and when to take each.   albuterol 108 (90 Base) MCG/ACT inhaler Commonly known as: VENTOLIN HFA INHALE 2 INHALATIONS INTO THE LUNGS EVERY 6 HOURS AS NEEDED FOR WHEEZING   methocarbamol 750 MG tablet Commonly known as: Robaxin-750 Take 1 tablet (750 mg total) by mouth 4 (four) times daily.   ondansetron 4 MG disintegrating tablet Commonly known as: ZOFRAN-ODT Take 1 tablet (4 mg total) by mouth every 6 (six) hours as needed for nausea or vomiting.   oxyCODONE 5 MG immediate release tablet Commonly known as: Oxy IR/ROXICODONE Take 1 tablet (5 mg total) by mouth every 6 (six) hours as needed for severe pain.   pantoprazole 40 MG tablet Commonly known as: Protonix Take 1 tablet (40 mg total) by mouth daily. What changed: Another medication with the same name was added. Make sure you understand how and when to take each.   pantoprazole 40 MG tablet Commonly known as: PROTONIX Take 1 tablet (40 mg total) by mouth daily. What changed: You were already taking a medication with the same name, and this prescription was added. Make sure you understand how and when to take each.   Sprintec 28 0.25-35 MG-MCG tablet Generic drug: norgestimate-ethinyl estradiol Take 1 tablet by mouth daily. Notes to patient: Medication may not be effective for the next 30 days.  Use precaution if necessary.  Activity: no heavy lifting for 4 weeks Diet:  Bariatric diet Wound Care: keep wound clean and dry  Follow-up:  With Dr. Thermon Leyland  Signed: Nickola Major Gurman [facility], [place]   08/27/2022, 6:50 AM

## 2022-08-28 ENCOUNTER — Telehealth (HOSPITAL_COMMUNITY): Payer: Self-pay | Admitting: *Deleted

## 2022-08-28 NOTE — Telephone Encounter (Signed)
Tell me about your pain and pain management? Pt denies pain currently Pain is at a level 7 sometimes when in motion, but usually goes down when she is at rest. The pain medicine Tylenol has been most helpful.  2.  Let's talk about fluid intake.  How much total fluid are you taking in? Pt states that s/he is getting in at least 40oz of fluid including protein shakes, bottled water, and broth. Encouraged pt to report any s/s of dehydration to [facility] that is not resolved by drinking fluids at home  3.  How much protein have you taken in the last 2 days? Pt states s/he is meeting her goal of 30g of protein each day with the protein shakes. Pt recommended to start her 2nd protein sooner in the day to be able to meet her goal.  4.  Have you had nausea?  Tell me about when have experienced nausea and what you did to help? Pt denies nausea.    5.  Tell me what your incisions look like?  "Incisions look fine". Pt denies a fever, chills.  Pt states incisions are not swollen, open, or draining.   6.  Have you been passing gas? BM?  Pt states that s/he has not had a BM.  Pt instructed to take either Miralax or MoM as instructed per "Gastric Bypass/Sleeve Discharge Home Care Instructions".  Pt to call surgeon's office if not able to have BM with medication.  Pt stated she had taken "a little bit yesterday but was scared to have to push and not bother the incisions" - pt was assured that normal bm are common following the procedure and that ambulation would help with supporting a healthy bm regimen. Pt was receptive.    7.  If a problem or question were to arise who would you call?  Do you know contact numbers for [facility], [facility], and [facility]? Pt denies dehydration symptoms.  Pt can describe s/sx of dehydration.  Pt knows to call [facility] for surgical, [facility] for nutrition, and [facility] for non-urgent questions or concerns.   8.  How has the walking going? Pt states s/he is walking around and able to be active without  difficulty.   9.Pt encouraged to use incentive spirometer, at least 10x every hour while awake until s/he sees the surgeon.  10.  How are your vitamins and calcium going?  How are you taking them? Pt plans to start on Monday

## 2022-08-31 NOTE — Transitions of Care (Post Inpatient/ED Visit) (Signed)
   08/31/2022  Name: Sarah Hutchinson MRN: [id number] DOB: 24-Apr-1991  Today's TOC FU Call Status: Today's TOC FU Call Status:: Unsuccessful Call (2nd Attempt) Unsuccessful Call (1st Attempt) Date: 08/27/22 Unsuccessful Call (2nd Attempt) Date: 08/31/22  Attempted to reach the patient regarding the most recent Inpatient/ED visit.  Follow Up Plan: No further outreach attempts will be made at this time. We have been unable to contact the patient.  Signature Juanda Crumble, [place] Direct Dial [phone]

## 2022-09-08 ENCOUNTER — Encounter: Payer: Self-pay | Admitting: Dietician

## 2022-09-08 ENCOUNTER — Encounter: Payer: BC Managed Care – PPO | Attending: Surgery | Admitting: Dietician

## 2022-09-08 VITALS — Ht 67.0 in | Wt 242.1 lb

## 2022-09-08 DIAGNOSIS — E282 Polycystic ovarian syndrome: Secondary | ICD-10-CM | POA: Diagnosis not present

## 2022-09-08 DIAGNOSIS — E669 Obesity, unspecified: Secondary | ICD-10-CM

## 2022-09-08 DIAGNOSIS — R7303 Prediabetes: Secondary | ICD-10-CM | POA: Diagnosis not present

## 2022-09-08 DIAGNOSIS — Z713 Dietary counseling and surveillance: Secondary | ICD-10-CM | POA: Insufficient documentation

## 2022-09-08 DIAGNOSIS — Z6841 Body Mass Index (BMI) 40.0 and over, adult: Secondary | ICD-10-CM | POA: Insufficient documentation

## 2022-09-08 NOTE — Progress Notes (Signed)
2 Week Post-Operative Nutrition Class   Patient was seen on 09/08/2022 for Post-Operative Nutrition education at [facility].    Surgery date: 08/25/2022 Surgery type: sleeve  NUTRITION ASSESSMENT   Anthropometrics  Start weight at [facility]: 257.6 lbs (date: 04/06/2022)  Height: 67 in Weight today: 242.1 BMI: 40.35 kg/m2     Clinical  Medical hx: anxiety, asthma Medications: Albuterol inhaler, Sprintec  Labs: Triglycerides 232; A1C 5.7; glucose 129 (pt states not fasting) Notable signs/symptoms: none noted Any previous deficiencies? No Bowel Habits: Every day to every other day no complaints   Body Composition Scale 09/08/2022  Current Body Weight 242.1  Total Body Fat % 42.3  Visceral Fat 10  Fat-Free Mass % 57.6   Total Body Water % 43.3  Muscle-Mass lbs 34.3  BMI 37.6  Body Fat Displacement          Torso  lbs 63.5         Left Leg  lbs 12.7         Right Leg  lbs 12.7         Left Arm  lbs 6.3         Right Arm  lbs 6.3      The following the learning objectives were met by the patient during this course: Identifies Soft Prepped Plan Advancement Guide  Identifies Soft, High Proteins (Phase 1), beginning 2 weeks post-operatively to 3 weeks post-operatively Identifies Additional Soft High Proteins, soft non-starchy vegetables, fruits and starches (Phase 2), beginning 3 weeks post-operatively to 3 months post-operatively Identifies appropriate sources of fluids, proteins, vegetables, fruits and starches Identifies appropriate fat sources and healthy verses unhealthy fat types   States protein, vegetable, fruit and starch recommendations and appropriate sources post-operatively Identifies the need for appropriate texture modifications, mastication, and bite sizes when consuming solids Identifies appropriate fat consumption and sources Identifies appropriate multivitamin and calcium sources post-operatively Describes the need for physical  activity post-operatively and will follow MD recommendations States when to call healthcare provider regarding medication questions or post-operative complications   Handouts given during class include: Soft Prepped Plan Advancement Guide   Follow-Up Plan: Patient will follow-up at [facility] in 10 weeks for 3 month post-op nutrition visit for diet advancement per MD.

## 2022-09-17 ENCOUNTER — Telehealth: Payer: Self-pay | Admitting: Dietician

## 2022-09-17 NOTE — Telephone Encounter (Signed)
RD called pt to verify fluid intake once starting soft, solid proteins 2 week post-bariatric surgery.   Daily Fluid intake: Daily Protein intake: Bowel Habits:   Concerns/issues:    Left Voice Message 

## 2022-10-20 ENCOUNTER — Ambulatory Visit: Payer: BC Managed Care – PPO | Admitting: Dietician

## 2022-11-09 DIAGNOSIS — Z03818 Encounter for observation for suspected exposure to other biological agents ruled out: Secondary | ICD-10-CM | POA: Diagnosis not present

## 2022-11-26 ENCOUNTER — Encounter: Payer: Self-pay | Admitting: Physician Assistant

## 2022-11-26 ENCOUNTER — Ambulatory Visit (INDEPENDENT_AMBULATORY_CARE_PROVIDER_SITE_OTHER): Payer: BC Managed Care – PPO | Admitting: Physician Assistant

## 2022-11-26 VITALS — BP 117/78 | HR 94 | Temp 98.6°F | Resp 15 | Ht 68.0 in | Wt 221.0 lb

## 2022-11-26 DIAGNOSIS — N62 Hypertrophy of breast: Secondary | ICD-10-CM

## 2022-11-26 DIAGNOSIS — Z3009 Encounter for other general counseling and advice on contraception: Secondary | ICD-10-CM

## 2022-11-26 MED ORDER — NORGESTIMATE-ETH ESTRADIOL 0.25-35 MG-MCG PO TABS: 1.0000 | ORAL_TABLET | Freq: Every day | ORAL | 3 refills | Status: DC

## 2022-11-26 NOTE — Progress Notes (Signed)
I,Vanessa  Vital,acting as a Neurosurgeon for [employer], PA-C.,have documented all relevant documentation on the behalf of Alfredia Ferguson, PA-C,as directed by  Alfredia Ferguson, PA-C while in the presence of Alfredia Ferguson, PA-C.    Established patient visit   Patient: Sarah Hutchinson   DOB: 1990/07/04   32 y.o. Female  MRN: [id number] Visit Date: 11/26/2022  Today's healthcare provider: Alfredia Ferguson, PA-C   Cc. contraception  Subjective    HPI  Patient is here today for birth control. States that OBGYN would not prescribe anymore if she was not going to keep being seen by them. Patient stated she would prefer to come in and be seen to have it prescribed. She would like to continue same birth control.  She also reports neck pain and back pain due to her large breast tissue. She is interested one day in a breast reduction.  Patient's last menstrual period was 11/17/2022.   Medications: Outpatient Medications Prior to Visit  Medication Sig   albuterol (VENTOLIN HFA) 108 (90 Base) MCG/ACT inhaler INHALE 2 INHALATIONS INTO THE LUNGS EVERY 6 HOURS AS NEEDED FOR WHEEZING   [DISCONTINUED] norgestimate-ethinyl estradiol (SPRINTEC 28) 0.25-35 MG-MCG tablet Take 1 tablet by mouth daily.   methocarbamol (ROBAXIN-750) 750 MG tablet Take 1 tablet (750 mg total) by mouth 4 (four) times daily. (Patient not taking: Reported on 11/26/2022)   oxyCODONE (OXY IR/ROXICODONE) 5 MG immediate release tablet Take 1 tablet (5 mg total) by mouth every 6 (six) hours as needed for severe pain. (Patient not taking: Reported on 11/26/2022)   pantoprazole (PROTONIX) 40 MG tablet Take 1 tablet (40 mg total) by mouth daily. (Patient not taking: Reported on 11/26/2022)   pantoprazole (PROTONIX) 40 MG tablet Take 1 tablet (40 mg total) by mouth daily. (Patient not taking: Reported on 11/26/2022)   [DISCONTINUED] acetaminophen (TYLENOL) 325 MG tablet Take 650 mg by mouth every 6 (six) hours as needed for moderate pain.  (Patient not taking: Reported on 11/26/2022)   [DISCONTINUED] ondansetron (ZOFRAN-ODT) 4 MG disintegrating tablet Take 1 tablet (4 mg total) by mouth every 6 (six) hours as needed for nausea or vomiting. (Patient not taking: Reported on 11/26/2022)   No facility-administered medications prior to visit.    Review of Systems  Musculoskeletal:  Positive for back pain.  All other systems reviewed and are negative.    Objective    BP 117/78 (BP Location: Left Arm, Patient Position: Sitting, Cuff Size: Large)   Pulse 94   Temp 98.6 F (37 C) (Oral)   Resp 15   Ht 5\' 8"  (1.727 m)   Wt 221 lb (100.2 kg)   LMP 11/17/2022   SpO2 99%   BMI 33.60 kg/m    Physical Exam Vitals reviewed.  Constitutional:      Appearance: She is not ill-appearing.  HENT:     Head: Normocephalic.  Eyes:     Conjunctiva/sclera: Conjunctivae normal.  Cardiovascular:     Rate and Rhythm: Normal rate.  Pulmonary:     Effort: Pulmonary effort is normal. No respiratory distress.  Neurological:     General: No focal deficit present.     Mental Status: She is alert and oriented to person, place, and time.  Psychiatric:        Mood and Affect: Mood normal.        Behavior: Behavior normal.      No results found for any visits on 11/26/22.  Assessment & Plan     1. Encounter  for counseling regarding contraception - norgestimate-ethinyl estradiol (SPRINTEC 28) 0.25-35 MG-MCG tablet; Take 1 tablet by mouth daily.  Dispense: 84 tablet; Refill: 3  2. Macromastia Recommending waiting at least 2 years post bariatric surgery  Return if symptoms worsen or fail to improve.      I, Alfredia Ferguson, PA-C have reviewed all documentation for this visit. The documentation on  11/26/22   for the exam, diagnosis, procedures, and orders are all accurate and complete. Alfredia Ferguson, PA-C [facility] [place] [address] [place], [place], [address] Office: [phone] Fax: [phone]   [facility]

## 2022-11-27 ENCOUNTER — Encounter: Payer: Self-pay | Admitting: Skilled Nursing Facility1

## 2022-11-27 ENCOUNTER — Encounter: Payer: BC Managed Care – PPO | Attending: Surgery | Admitting: Skilled Nursing Facility1

## 2022-11-27 VITALS — Ht 68.0 in | Wt 221.6 lb

## 2022-11-27 DIAGNOSIS — E669 Obesity, unspecified: Secondary | ICD-10-CM | POA: Diagnosis not present

## 2022-11-27 NOTE — Progress Notes (Signed)
Bariatric Nutrition Follow-Up Visit Medical Nutrition Therapy   Surgery date: 08/25/2022 Surgery type: sleeve  NUTRITION ASSESSMENT   Anthropometrics  Start weight at [facility]: 257.6 lbs (date: 04/06/2022)  Height: 67 in Weight today: 221.6   Clinical  Medical hx: anxiety, asthma Medications: Albuterol inhaler, Sprintec  Labs: Triglycerides 232; A1C 5.7; glucose 129 (pt states not fasting) Notable signs/symptoms: none noted Any previous deficiencies? No Bowel Habits: Every day to every other day no complaints   Body Composition Scale 09/08/2022 11/27/2022  Current Body Weight 242.1 221.6  Total Body Fat % 42.3 38.3  Visceral Fat 10 9  Fat-Free Mass % 57.6 61.6   Total Body Water % 43.3 45.3  Muscle-Mass lbs 34.3 35.8  BMI 37.6 33.6  Body Fat Displacement           Torso  lbs 63.5 52.6         Left Leg  lbs 12.7 10.5         Right Leg  lbs 12.7 10.5         Left Arm  lbs 6.3 5.2         Right Arm  lbs 6.3 5.2     Lifestyle & Dietary Hx  Pt states she loves to do arms.   Pt states she mostly works from home but sometimes goes into the office.   Estimated daily fluid intake: 80 oz Estimated daily protein intake: 80+ g Supplements: multi and calcium  Current average weekly physical activity: gym in apartment and [employer] fitness trainer; 4-5 days a week cardio 30 minutes and weights; walks 1 mile daily  24-Hr Dietary Recall First Meal: cottage cheese + jam or egg Snack:  protein shake Second Meal: chicken Snack:  cottage cheese Third Meal: chicken  Snack:  Beverages: water, protein water  Post-Op Goals/ Signs/ Symptoms Using straws: no Drinking while eating: no Chewing/swallowing difficulties: no Changes in vision: no Changes to mood/headaches: no Hair loss/changes to skin/nails: no Difficulty focusing/concentrating: no Sweating: no Limb weakness: no Dizziness/lightheadedness: no Palpitations: no  Carbonated/caffeinated beverages: no N/V/D/C/Gas: no Abdominal  pain: no Dumping syndrome: no    NUTRITION DIAGNOSIS  Overweight/obesity ([place]-3.3) related to past poor dietary habits and physical inactivity as evidenced by completed bariatric surgery and following dietary guidelines for continued weight loss and healthy nutrition status.     NUTRITION INTERVENTION Nutrition counseling (C-1) and education (E-2) to facilitate bariatric surgery goals, including: The importance of consuming adequate calories as well as certain nutrients daily due to the body's need for essential vitamins, minerals, and fats The importance of daily physical activity and to reach a goal of at least 150 minutes of moderate to vigorous physical activity weekly (or as directed by their physician) due to benefits such as increased musculature and improved lab values The importance of intuitive eating specifically learning hunger-satiety cues and understanding the importance of learning a new body: The importance of mindful eating to avoid grazing behaviors  A balanced diet typically includes a variety of foods from different food groups, providing essential nutrients such as carbohydrates, proteins, fats, vitamins, and minerals. Tips for maintaining a balanced eating plan: Focus on protein: Prioritize lean protein sources to support muscle preservation. Include a variety of fruits and vegetables: Aim to consume a colorful array of fruits and vegetables, as they offer different vitamins, minerals, and antioxidants. Choose whole grains: Opt for whole grains such as brown rice, quinoa, and whole wheat, as they contain more nutrients and fiber compared to refined grains.  Incorporate lean proteins: Include sources of lean protein in your diet, such as poultry, fish, beans, legumes, tofu, and low-fat dairy products. Include healthy fats: Choose sources of healthy fats, such as avocados, nuts, seeds, and olive oil. Limit saturated and trans fats found in processed and fried foods. Monitor  portion sizes: Be mindful of portion sizes to avoid over or under eating. Eating in moderation is important for maintaining your health goals Stay hydrated: Drink at least 64 ounces of water throughout the day. Water is essential for various bodily functions and can also help control hunger. Stay well-hydrated by sipping water throughout the day. Limit added sugars and salt: Minimize the consumption of foods and beverages high in added sugars and sodium. Check nutrition labels to make informed choices. Plan meals and snacks: Planning meals in advance can help ensure that you have a balanced mix of nutrients throughout the day. Be mindful when eating: Small, frequent meals: Consume small portions and eat slowly to avoid discomfort. Be present and engaged with your meal. Avoid distractions like television or electronic devices. Focus on the act of eating and the sensory experience of the food. This will help you with any emotional eating such as eating out of boredom  By practicing mindful eating and listening to your body's cues, you can foster a healthier relationship with food, promote better digestion, and support overall well-being.  Handouts Provided Include  Standard prep  Learning Style & Readiness for Change Teaching method utilized: Visual & Auditory  Demonstrated degree of understanding via: Teach Back  Readiness Level: action Barriers to learning/adherence to lifestyle change: none identified   RD's Notes for Next Visit Assess adherence to pt chosen goals    MONITORING & EVALUATION Dietary intake, weekly physical activity, body weight  Next Steps Patient is to follow-up in 3-4 months

## 2022-12-10 ENCOUNTER — Ambulatory Visit: Payer: BC Managed Care – PPO | Admitting: Dietician

## 2023-01-11 ENCOUNTER — Ambulatory Visit: Payer: Self-pay
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
## 2023-01-12 ENCOUNTER — Ambulatory Visit
Admission: RE | Admit: 2023-01-12 | Discharge: 2023-01-12 | Disposition: A | Discharge status: Home / Self Care | Payer: BC Managed Care – PPO | Attending: Family Medicine | Admitting: Family Medicine

## 2023-01-12 ENCOUNTER — Ambulatory Visit
Admission: RE | Admit: 2023-01-12 | Discharge: 2023-01-12 | Disposition: A | Discharge status: Home / Self Care | Payer: BC Managed Care – PPO | Source: Ambulatory Visit | Attending: Family Medicine | Admitting: Family Medicine

## 2023-01-12 ENCOUNTER — Other Ambulatory Visit: Payer: Self-pay | Admitting: Family Medicine

## 2023-01-12 ENCOUNTER — Encounter: Payer: Self-pay | Admitting: Family Medicine

## 2023-01-12 ENCOUNTER — Ambulatory Visit (INDEPENDENT_AMBULATORY_CARE_PROVIDER_SITE_OTHER): Payer: BC Managed Care – PPO | Admitting: Family Medicine

## 2023-01-12 DIAGNOSIS — M546 Pain in thoracic spine: Secondary | ICD-10-CM

## 2023-01-12 DIAGNOSIS — E041 Nontoxic single thyroid nodule: Secondary | ICD-10-CM

## 2023-01-12 DIAGNOSIS — M542 Cervicalgia: Secondary | ICD-10-CM

## 2023-01-12 DIAGNOSIS — Y929 Unspecified place or not applicable: Secondary | ICD-10-CM | POA: Diagnosis not present

## 2023-01-12 DIAGNOSIS — S299XXA Unspecified injury of thorax, initial encounter: Secondary | ICD-10-CM | POA: Diagnosis not present

## 2023-01-12 DIAGNOSIS — Y939 Activity, unspecified: Secondary | ICD-10-CM | POA: Diagnosis not present

## 2023-01-12 DIAGNOSIS — Z041 Encounter for examination and observation following transport accident: Secondary | ICD-10-CM | POA: Diagnosis not present

## 2023-01-12 MED ORDER — IOHEXOL 350 MG/ML SOLN
75.0000 mL | Freq: Once | INTRAVENOUS | Status: AC | PRN
  Administered 2023-01-12: 75 mL via INTRAVENOUS

## 2023-01-12 MED ORDER — CYCLOBENZAPRINE HCL 5 MG PO TABS
ORAL_TABLET | ORAL | 0 refills | Status: DC
Start: 2023-01-12 — End: 2023-08-03

## 2023-01-12 MED ORDER — PREDNISONE 50 MG PO TABS: 50.0000 mg | ORAL_TABLET | Freq: Every day | ORAL | 0 refills | Status: DC
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
## 2023-01-17 ENCOUNTER — Encounter: Payer: Self-pay | Admitting: Family Medicine

## 2023-01-18 ENCOUNTER — Ambulatory Visit
Admission: RE | Admit: 2023-01-18 | Discharge: 2023-01-18 | Disposition: A | Discharge status: Home / Self Care | Payer: BC Managed Care – PPO | Source: Ambulatory Visit | Attending: Family Medicine | Admitting: Family Medicine

## 2023-01-18 DIAGNOSIS — E041 Nontoxic single thyroid nodule: Secondary | ICD-10-CM | POA: Insufficient documentation

## 2023-02-23 DIAGNOSIS — Z1321 Encounter for screening for nutritional disorder: Secondary | ICD-10-CM | POA: Diagnosis not present

## 2023-02-23 DIAGNOSIS — Z903 Acquired absence of stomach [part of]: Secondary | ICD-10-CM | POA: Diagnosis not present

## 2023-02-23 DIAGNOSIS — R5383 Other fatigue: Secondary | ICD-10-CM | POA: Diagnosis not present

## 2023-02-26 DIAGNOSIS — E569 Vitamin deficiency, unspecified: Secondary | ICD-10-CM | POA: Diagnosis not present

## 2023-02-26 DIAGNOSIS — Z1321 Encounter for screening for nutritional disorder: Secondary | ICD-10-CM | POA: Diagnosis not present

## 2023-02-26 DIAGNOSIS — R5383 Other fatigue: Secondary | ICD-10-CM | POA: Diagnosis not present

## 2023-03-25 ENCOUNTER — Ambulatory Visit: Payer: BC Managed Care – PPO | Admitting: Plastic Surgery

## 2023-03-25 ENCOUNTER — Encounter: Payer: Self-pay | Admitting: Plastic Surgery

## 2023-03-25 VITALS — BP 115/76 | HR 100 | Ht 68.0 in | Wt 199.6 lb

## 2023-03-25 DIAGNOSIS — M546 Pain in thoracic spine: Secondary | ICD-10-CM | POA: Diagnosis not present

## 2023-03-25 DIAGNOSIS — G8929 Other chronic pain: Secondary | ICD-10-CM

## 2023-03-25 DIAGNOSIS — Z9884 Bariatric surgery status: Secondary | ICD-10-CM | POA: Diagnosis not present

## 2023-03-25 DIAGNOSIS — M542 Cervicalgia: Secondary | ICD-10-CM

## 2023-03-25 DIAGNOSIS — N62 Hypertrophy of breast: Secondary | ICD-10-CM

## 2023-03-25 NOTE — Progress Notes (Signed)
Referring Provider Alfredia Ferguson, PA-C [address] [place],  [place] [address]   CC:  Chief Complaint  Patient presents with   Consult           Sarah Hutchinson is an 32 y.o. female.  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  Since that time with her weight loss she has noted increasing difficulty with upper back and neck pain due to the large size of her breast.  She states that this is started to cause difficulty with posture and with finding bras that fit appropriately.  She is interested in a bilateral breast reduction.  Allergies  Allergen Reactions   Tape Swelling    Outpatient Encounter Medications as of 03/25/2023  Medication Sig   albuterol (VENTOLIN HFA) 108 (90 Base) MCG/ACT inhaler INHALE 2 INHALATIONS INTO THE LUNGS EVERY 6 HOURS AS NEEDED FOR WHEEZING   calcium carbonate (OSCAL) 1500 (600 Ca) MG TABS tablet Take by mouth 2 (two) times daily with a meal.   cyclobenzaprine (FLEXERIL) 5 MG tablet Take 1-2 tablets at night before sleep.   Multiple Vitamin (MULTIVITAMIN) capsule Take 1 capsule by mouth daily.   norgestimate-ethinyl estradiol (ORTHO-CYCLEN) 0.25-35 MG-MCG tablet Take 1 tablet by mouth daily.   predniSONE (DELTASONE) 50 MG tablet Take 1 tablet (50 mg total) by mouth daily.   No facility-administered encounter medications on file as of 03/25/2023.     Past Medical History:  Diagnosis Date   Anxiety 04/2021   Every since being diagnosed with PCOS ive had anxiety   Asthma    Asthma    Family history of adverse reaction to anesthesia    grandmother went under and never woke up   HSV-2 infection    PCOS (polycystic ovarian syndrome)     Past Surgical History:  Procedure Laterality Date   BIOPSY  06/19/2022   Procedure: BIOPSY;  Surgeon: Quentin Ore, MD;  Location: [facility];  Service: General;;   ESOPHAGOGASTRODUODENOSCOPY N/A 06/19/2022   Procedure:  ESOPHAGOGASTRODUODENOSCOPY (EGD);  Surgeon: Quentin Ore, MD;  Location: [facility];  Service: General;  Laterality: N/A;   FOOT SURGERY Right    FRACTURE SURGERY  08/2014   Planters plate reconstructive (R) foot   UPPER GI ENDOSCOPY N/A 08/25/2022   Procedure: UPPER GI ENDOSCOPY;  Surgeon: Quentin Ore, MD;  Location: [facility];  Service: General;  Laterality: N/A;    Family History  Problem Relation Age of Onset   Diabetes Mother    Hypertension Mother    Obesity Mother    Diabetes Father    Obesity Father    Diabetes Paternal Grandfather    Diabetes Paternal Grandmother    Diabetes Brother    Diabetes Paternal Aunt    Diabetes Paternal Uncle    Obesity Brother    Obesity Paternal Aunt     Social History   Social History Narrative   ** Merged History Encounter **         Review of Systems General: Denies fevers, chills, weight loss CV: Denies chest pain, shortness of breath, palpitations Breast: Patient denies any specific symptoms with her breast other than the upper back and neck pain.  Physical Exam    03/25/2023    8:35 AM 01/12/2023    3:10 PM 11/27/2022   10:44 AM  Vitals with BMI  Height 5\' 8"  5\' 8"  5\' 8"   Weight 199 lbs 10 oz 207 lbs  221 lbs 10 oz  BMI 30.36 31.48 33.7  Systolic 115 117   Diastolic 76 76   Pulse 100 82     General:  No acute distress,  Alert and oriented, Non-Toxic, Normal speech and affect Breast: Patient has large pendulous breast with grade 3 ptosis.  There are no dominant masses on physical exam and the nipples are normal in appearance without obvious nipple discharge.  The sternal notch to nipple distance is 36 cm on the right and 38 cm on the left the fold to nipple distance is 15 cm on the right and 15 cm on the left. Mammogram: Not applicable due to age Assessment/Plan Macromastia: Patient has large breasts that are heavy and she would benefit from a bilateral breast reduction.  I believe that I can remove between 600  g and 700 g per breast.  I discussed breast reductions at length with the patient.  We discussed the location of the incisions and the unpredictable nature of scarring and healing.  We discussed the risks of bleeding, infection, and seroma formation.  She understands I will use drains to decrease the risk of seroma formation.  We discussed the risk of nipple loss due to nipple ischemia.  We discussed the fact that if she chooses to have children in the future that there is a risk of up to 10% that she may not be able to successfully breast-feed after breast reduction.  We discussed the postoperative restrictions of no heavy lifting greater than 20 pounds, no vigorous activity, no submerging the incisions in water for 6 weeks.  She may return to light activity as tolerated and is strongly encouraged to begin ambulating the day of surgery to help decrease the risk of DVT.  All questions were answered to her satisfaction.  Photographs were obtained today with her consent.  Will submit her for physical therapy and a bilateral breast reduction at her request.  Santiago Glad 03/25/2023, 8:58 AM

## 2023-03-26 ENCOUNTER — Ambulatory Visit: Payer: BC Managed Care – PPO | Admitting: Rehabilitative and Restorative Service Providers"

## 2023-03-29 ENCOUNTER — Ambulatory Visit: Payer: BC Managed Care – PPO | Admitting: Physical Therapy

## 2023-03-29 ENCOUNTER — Ambulatory Visit: Payer: BC Managed Care – PPO | Admitting: Skilled Nursing Facility1

## 2023-03-31 ENCOUNTER — Encounter: Payer: Self-pay | Admitting: Plastic Surgery

## 2023-03-31 NOTE — Telephone Encounter (Signed)
See msg about denial

## 2023-04-06 ENCOUNTER — Ambulatory Visit: Payer: BC Managed Care – PPO

## 2023-04-06 NOTE — Therapy (Signed)
OUTPATIENT PHYSICAL THERAPY CERVICAL EVALUATION   Patient Name: Sarah Hutchinson MRN: [id number] DOB:02/03/1991, 32 y.o., female Today's Date: 04/07/2023  END OF SESSION:  PT End of Session - 04/07/23 1548     Visit Number 1    Date for PT Re-Evaluation 05/19/23    Authorization - Number of Visits 60    PT Start Time 1018    PT Stop Time 1052    PT Time Calculation (min) 34 min    Activity Tolerance Patient tolerated treatment well    Behavior During Therapy [facility] for tasks assessed/performed             Past Medical History:  Diagnosis Date   Anxiety 04/2021   Every since being diagnosed with PCOS ive had anxiety   Asthma    Asthma    Family history of adverse reaction to anesthesia    grandmother went under and never woke up   HSV-2 infection    PCOS (polycystic ovarian syndrome)    Past Surgical History:  Procedure Laterality Date   BIOPSY  06/19/2022   Procedure: BIOPSY;  Surgeon: Quentin Ore, MD;  Location: [facility];  Service: General;;   ESOPHAGOGASTRODUODENOSCOPY N/A 06/19/2022   Procedure: ESOPHAGOGASTRODUODENOSCOPY (EGD);  Surgeon: Quentin Ore, MD;  Location: [facility];  Service: General;  Laterality: N/A;   FOOT SURGERY Right    FRACTURE SURGERY  08/2014   Planters plate reconstructive (R) foot   UPPER GI ENDOSCOPY N/A 08/25/2022   Procedure: UPPER GI ENDOSCOPY;  Surgeon: Quentin Ore, MD;  Location: [facility];  Service: General;  Laterality: N/A;   Patient Active Problem List   Diagnosis Date Noted   Morbid obesity with BMI of 40.0-44.9, adult (HCC) 08/25/2022   HSV-2 (herpes simplex virus 2) infection 01/29/2022   PCOS (polycystic ovarian syndrome) 01/29/2022   Obesity (BMI 30-39.9) 01/29/2022   Prediabetes 01/29/2022   Anxiety 01/29/2022   Postpartum care following vaginal delivery 06/16/2015   Left wrist injury 12/04/2013    PCP: Alfredia Ferguson, PA-C  REFERRING PROVIDER: Santiago Glad, MD  REFERRING DIAG: N62  (ICD-10-CM) - Macromastia  THERAPY DIAG:  Other muscle spasm  Muscle weakness (generalized)  Abnormal posture  Cervicalgia  Pain in thoracic spine  Rationale for Evaluation and Treatment: Rehabilitation  ONSET DATE: 07/2022  SUBJECTIVE:                                                                                                                                                                                                         SUBJECTIVE STATEMENT: Patient  had weight loss surgery in February 2024. Since then she has experienced back and neck pain due to the size of her breasts. She has tried different bras and they haven't been helpful. Back pain is worse than the neck pain. Patient has indents on her shoulders from her bra straps. Sleep is uncomfortable. Patient occasionally has numbness in her upper trap area. Patient is looking to get her surgery at the beginning of 2025.  Hand dominance: Right  PERTINENT HISTORY:  Anxiety  PAIN:  Are you having pain? Yes: NPRS scale: 7-8 (currently) 10(worst)/10 Pain location: Upper thoracic spine bilaterally and lower C- spine into upper traps Pain description: achy Aggravating factors: Wearing more supportive bra - she feels more pressure Relieving factors: Without a bra in a semi recline position  PRECAUTIONS: None  RED FLAGS: None     WEIGHT BEARING RESTRICTIONS: No  FALLS:  Has patient fallen in last 6 months? No   OCCUPATION: Credit Specialist   PLOF:Independent Leisure: Cardio; walking  PATIENT GOALS: Exercises to help her pain  NEXT MD VISIT: PRN  OBJECTIVE:  Note: Objective measures were completed at Evaluation unless otherwise noted.  DIAGNOSTIC FINDINGS:  None  PATIENT SURVEYS:  Modified Oswestry 26/50   COGNITION: Overall cognitive status: Within functional limits for tasks assessed    POSTURE: rounded shoulders and forward head  PALPATION: Thoracic: hypomobility T1-T6 Increased muscle  spasms in bilateral upper trap & upper erector spinae  CERVICAL ROM: WFL; tightness with cervical flexion    UPPER EXTREMITY ROM: WFL  UPPER EXTREMITY MMT: Grossly 4+/5 bilaterally    TODAY'S TREATMENT:                                                                                                                              DATE:   04/07/2023: Established Initial HEP  Grade V T3-T5- patient verbalized relief   PATIENT EDUCATION:  Education details: thoracic mobility; seated posture  Person educated: Patient Education method: Explanation, Demonstration, and Handouts Education comprehension: verbalized understanding, returned demonstration, and needs further education  HOME EXERCISE PROGRAM: Access Code: [email or web address] URL: [email or web address] Date: 04/07/2023 Prepared by: Claude Manges  Exercises - Seated Scapular Retraction  - 2 x daily - 7 x weekly - 1 sets - 10 reps - Cat Cow  - 1 x daily - 7 x weekly - 1 sets - 10 reps - Quadruped Thoracic Rotation - Reach Under  - 2 x daily - 7 x weekly - 1 sets - 10 reps - Seated Upper Trapezius Stretch  - 1 x daily - 7 x weekly - 3 sets - 20-30 hold - Seated Levator Scapulae Stretch  - 1 x daily - 7 x weekly - 3 sets - 20-30 hold - Doorway Pec Stretch at 90 Degrees Abduction  - 1 x daily - 7 x weekly - 3 sets - 10 reps  ASSESSMENT:  CLINICAL IMPRESSION: Patient is a 32 y.o. female who was seen today for physical  therapy evaluation and treatment for macromastia. Patient had weight loss surgery in February and after losing weight she felt her breast were weighing her down and started to experience increased neck and back pain. Based on evaluation noted poor posture, poor engagement of back extensors, and muscle spasms of bilateral upper traps. Patient's pain is interfering with her ability to sleep comfortably, laying on her stomach, and going to the gym regularly. Patient will benefit from skilled PT to address the below  impairments and improve overall function.   OBJECTIVE IMPAIRMENTS: decreased strength, hypomobility, increased muscle spasms, postural dysfunction, and pain.   ACTIVITY LIMITATIONS: sleeping  PARTICIPATION LIMITATIONS: community activity and occupation  PERSONAL FACTORS: 1 comorbidity: Anxiety  are also affecting patient's functional outcome.   REHAB POTENTIAL: Good  CLINICAL DECISION MAKING: Stable/uncomplicated  EVALUATION COMPLEXITY: Low   GOALS: Goals reviewed with patient? Yes  SHORT TERM GOALS: Target date: 04/28/2023  Patient will be independent with initial HEP. Baseline:  Goal status: INITIAL  2.  Patient will report < or = to 6/10 pain while sitting. Baseline: 8/10 Goal status: INITIAL     LONG TERM GOALS: Target date: 05/19/2023 Patient will demonstrate independence in advanced HEP. Baseline:  Goal status: INITIAL  2.  Patient will report < or = to 4/10 pain while sitting Baseline: 8/10 Goal status: INITIAL  3.  Patient will demonstrate appropriate seated posture. Baseline: rounded shoulders Goal status: INITIAL  4.  Patient will report an 85% improvement in symptoms. Baseline:  Goal status: INITIAL     PLAN:  PT FREQUENCY: 2x/week  PT DURATION: 6 weeks  PLANNED INTERVENTIONS: Therapeutic exercises, Therapeutic activity, Neuromuscular re-education, Balance training, Gait training, Patient/Family education, Self Care, Joint mobilization, Joint manipulation, Stair training, Vestibular training, Canalith repositioning, Aquatic Therapy, Dry Needling, Electrical stimulation, Spinal manipulation, Spinal mobilization, Cryotherapy, Moist heat, scar mobilization, Vasopneumatic device, Traction, Ultrasound, Ionotophoresis 4mg /ml Dexamethasone, and Manual therapy  PLAN FOR NEXT SESSION: Assess HEP; postural strengthening   Claude Manges, PT 04/07/2023, 3:49 PM [facility] [address] [place], [place]  [address] Phone # [phone] Fax [phone]

## 2023-04-07 ENCOUNTER — Ambulatory Visit: Payer: BC Managed Care – PPO | Attending: Plastic Surgery | Admitting: Physical Therapy

## 2023-04-07 ENCOUNTER — Encounter: Payer: Self-pay | Admitting: Physical Therapy

## 2023-04-07 ENCOUNTER — Other Ambulatory Visit: Payer: Self-pay

## 2023-04-07 DIAGNOSIS — M6281 Muscle weakness (generalized): Secondary | ICD-10-CM | POA: Insufficient documentation

## 2023-04-07 DIAGNOSIS — M62838 Other muscle spasm: Secondary | ICD-10-CM | POA: Diagnosis not present

## 2023-04-07 DIAGNOSIS — M546 Pain in thoracic spine: Secondary | ICD-10-CM | POA: Diagnosis not present

## 2023-04-07 DIAGNOSIS — M542 Cervicalgia: Secondary | ICD-10-CM | POA: Insufficient documentation

## 2023-04-07 DIAGNOSIS — R293 Abnormal posture: Secondary | ICD-10-CM | POA: Diagnosis not present

## 2023-04-07 DIAGNOSIS — N62 Hypertrophy of breast: Secondary | ICD-10-CM | POA: Diagnosis not present

## 2023-04-13 ENCOUNTER — Ambulatory Visit: Payer: BC Managed Care – PPO | Admitting: Physical Therapy

## 2023-04-13 ENCOUNTER — Encounter: Payer: Self-pay | Admitting: Physical Therapy

## 2023-04-13 DIAGNOSIS — M6281 Muscle weakness (generalized): Secondary | ICD-10-CM | POA: Diagnosis not present

## 2023-04-13 DIAGNOSIS — M542 Cervicalgia: Secondary | ICD-10-CM

## 2023-04-13 DIAGNOSIS — M546 Pain in thoracic spine: Secondary | ICD-10-CM | POA: Diagnosis not present

## 2023-04-13 DIAGNOSIS — M62838 Other muscle spasm: Secondary | ICD-10-CM | POA: Diagnosis not present

## 2023-04-13 DIAGNOSIS — N62 Hypertrophy of breast: Secondary | ICD-10-CM | POA: Diagnosis not present

## 2023-04-13 DIAGNOSIS — R293 Abnormal posture: Secondary | ICD-10-CM | POA: Diagnosis not present

## 2023-04-13 NOTE — Therapy (Signed)
OUTPATIENT PHYSICAL THERAPY CERVICAL TREATMENT   Patient Name: Sarah Hutchinson MRN: [id number] DOB:06/24/1991, 32 y.o., female Today's Date: 04/13/2023  END OF SESSION:  PT End of Session - 04/13/23 0844     Visit Number 2    Date for PT Re-Evaluation 05/19/23    Authorization Type Blue Cross Blue [employer]    Authorization - Number of Visits 60    PT Start Time 0802    PT Stop Time [phone]    PT Time Calculation (min) 40 min    Activity Tolerance Patient tolerated treatment well    Behavior During Therapy [facility] for tasks assessed/performed              Past Medical History:  Diagnosis Date   Anxiety 04/2021   Every since being diagnosed with PCOS ive had anxiety   Asthma    Asthma    Family history of adverse reaction to anesthesia    grandmother went under and never woke up   HSV-2 infection    PCOS (polycystic ovarian syndrome)    Past Surgical History:  Procedure Laterality Date   BIOPSY  06/19/2022   Procedure: BIOPSY;  Surgeon: Quentin Ore, MD;  Location: [facility];  Service: General;;   ESOPHAGOGASTRODUODENOSCOPY N/A 06/19/2022   Procedure: ESOPHAGOGASTRODUODENOSCOPY (EGD);  Surgeon: Quentin Ore, MD;  Location: [facility];  Service: General;  Laterality: N/A;   FOOT SURGERY Right    FRACTURE SURGERY  08/2014   Planters plate reconstructive (R) foot   UPPER GI ENDOSCOPY N/A 08/25/2022   Procedure: UPPER GI ENDOSCOPY;  Surgeon: Quentin Ore, MD;  Location: [facility];  Service: General;  Laterality: N/A;   Patient Active Problem List   Diagnosis Date Noted   Morbid obesity with BMI of 40.0-44.9, adult (HCC) 08/25/2022   HSV-2 (herpes simplex virus 2) infection 01/29/2022   PCOS (polycystic ovarian syndrome) 01/29/2022   Obesity (BMI 30-39.9) 01/29/2022   Prediabetes 01/29/2022   Anxiety 01/29/2022   Postpartum care following vaginal delivery 06/16/2015   Left wrist injury 12/04/2013    PCP: Alfredia Ferguson, PA-C  REFERRING  PROVIDER: Santiago Glad, MD  REFERRING DIAG: N62 (ICD-10-CM) - Macromastia  THERAPY DIAG:  Other muscle spasm  Muscle weakness (generalized)  Abnormal posture  Cervicalgia  Pain in thoracic spine  Rationale for Evaluation and Treatment: Rehabilitation  ONSET DATE: 07/2022  SUBJECTIVE:  SUBJECTIVE STATEMENT: Patient reports she is feeling a little sore today but her exercises are going well. She is feeling some relief form doing them. Pain is currently 7/10.   EVAL: Patient had weight loss surgery in February 2024. Since then she has experienced back and neck pain due to the size of her breasts. She has tried different bras and they haven't been helpful. Back pain is worse than the neck pain. Patient has indents on her shoulders from her bra straps. Sleep is uncomfortable. Patient occasionally has numbness in her upper trap area. Patient is looking to get her surgery at the beginning of 2025.  Hand dominance: Right  PERTINENT HISTORY:  Anxiety  PAIN: 04/13/2023 Are you having pain? Yes: NPRS scale: 7/10 Pain location: Upper thoracic spine bilaterally and lower C- spine into upper traps Pain description: achy Aggravating factors: Wearing more supportive bra - she feels more pressure Relieving factors: Without a bra in a semi recline position  PRECAUTIONS: None  RED FLAGS: None     WEIGHT BEARING RESTRICTIONS: No  FALLS:  Has patient fallen in last 6 months? No   OCCUPATION: Credit Specialist   PLOF:Independent Leisure: Cardio; walking  PATIENT GOALS: Exercises to help her pain  NEXT MD VISIT: PRN  OBJECTIVE:  Note: Objective measures were completed at Evaluation unless otherwise noted.  DIAGNOSTIC FINDINGS:  None  PATIENT SURVEYS:  Modified Oswestry 26/50    COGNITION: Overall cognitive status: Within functional limits for tasks assessed    POSTURE: rounded shoulders and forward head  PALPATION: Thoracic: hypomobility T1-T6 Increased muscle spasms in bilateral upper trap & upper erector spinae  CERVICAL ROM: WFL; tightness with cervical flexion    UPPER EXTREMITY ROM: WFL  UPPER EXTREMITY MMT: Grossly 4+/5 bilaterally    TODAY'S TREATMENT:                                                                                                                              DATE:  04/13/2023 Review HEP- required minimal form cues for correction Child's Pose 2 x 30 secs Grade V T3-T5- patient verbalized relief  Standing Rows with green TB 2 x 10 Standing shoulder extension with green TB 2 x 10 Standing shoulder abduction with green TB 2 x 10 3 way scap stabilization x 8 each green loop Standing shoulder flexion with abduction against green loop 2 x 5 Lat Pull Downs 30# 2 x 10 Seated Matrix Rows 30# 2 x 10 UBE 5 mins (3 min forwards/ 2 mins)- PT present to discuss status  04/07/2023: Established Initial HEP  Grade V T3-T5- patient verbalized relief   PATIENT EDUCATION:  Education details: thoracic mobility; seated posture  Person educated: Patient Education method: Explanation, Demonstration, and Handouts Education comprehension: verbalized understanding, returned demonstration, and needs further education  HOME EXERCISE PROGRAM: Access Code: [email or web address] URL: [email or web address] Date: 04/07/2023 Prepared by: Claude Manges  Exercises - Seated Scapular Retraction  - 2 x daily - 7 x weekly -  1 sets - 10 reps - Cat Cow  - 1 x daily - 7 x weekly - 1 sets - 10 reps - Quadruped Thoracic Rotation - Reach Under  - 2 x daily - 7 x weekly - 1 sets - 10 reps - Seated Upper Trapezius Stretch  - 1 x daily - 7 x weekly - 3 sets - 20-30 hold - Seated Levator Scapulae Stretch  - 1 x daily - 7 x weekly - 3 sets - 20-30 hold -  Doorway Pec Stretch at 90 Degrees Abduction  - 1 x daily - 7 x weekly - 3 sets - 10 reps  ASSESSMENT:  CLINICAL IMPRESSION: Today's treatment session focused on periscapular stability and thoracic mobility. Patient verbalized compliance with HEP and feeling the relief of doing the exercises regularly. Reviewed patient's HEP and updated it to add more periscapular strengthening and child's pose. Patient required moderate verbal cues for form correction and correct posture. Patient verbalized feeling relief after Grade V manipulation on upper T- spine. Patient will benefit from skilled PT to address the below impairments and improve overall function.    OBJECTIVE IMPAIRMENTS: decreased strength, hypomobility, increased muscle spasms, postural dysfunction, and pain.   ACTIVITY LIMITATIONS: sleeping  PARTICIPATION LIMITATIONS: community activity and occupation  PERSONAL FACTORS: 1 comorbidity: Anxiety  are also affecting patient's functional outcome.   REHAB POTENTIAL: Good  CLINICAL DECISION MAKING: Stable/uncomplicated  EVALUATION COMPLEXITY: Low   GOALS: Goals reviewed with patient? Yes  SHORT TERM GOALS: Target date: 04/28/2023  Patient will be independent with initial HEP. Baseline:  Goal status: INITIAL  2.  Patient will report < or = to 6/10 pain while sitting. Baseline: 8/10 Goal status: INITIAL     LONG TERM GOALS: Target date: 05/19/2023 Patient will demonstrate independence in advanced HEP. Baseline:  Goal status: INITIAL  2.  Patient will report < or = to 4/10 pain while sitting Baseline: 8/10 Goal status: INITIAL  3.  Patient will demonstrate appropriate seated posture. Baseline: rounded shoulders Goal status: INITIAL  4.  Patient will report an 85% improvement in symptoms. Baseline:  Goal status: INITIAL     PLAN:  PT FREQUENCY: 2x/week  PT DURATION: 6 weeks  PLANNED INTERVENTIONS: Therapeutic exercises, Therapeutic activity, Neuromuscular  re-education, Balance training, Gait training, Patient/Family education, Self Care, Joint mobilization, Joint manipulation, Stair training, Vestibular training, Canalith repositioning, Aquatic Therapy, Dry Needling, Electrical stimulation, Spinal manipulation, Spinal mobilization, Cryotherapy, Moist heat, scar mobilization, Vasopneumatic device, Traction, Ultrasound, Ionotophoresis 4mg /ml Dexamethasone, and Manual therapy  PLAN FOR NEXT SESSION: Assess update HEP; continue periscapular strengthening   Claude Manges, PT 04/13/23 8:45 AM  [facility] [address] [place], [place] [address] Phone # [phone] Fax [phone]

## 2023-04-15 ENCOUNTER — Encounter: Payer: Self-pay | Admitting: Physical Therapy

## 2023-04-15 ENCOUNTER — Ambulatory Visit: Payer: BC Managed Care – PPO | Admitting: Physical Therapy

## 2023-04-15 DIAGNOSIS — M546 Pain in thoracic spine: Secondary | ICD-10-CM

## 2023-04-15 DIAGNOSIS — R293 Abnormal posture: Secondary | ICD-10-CM | POA: Diagnosis not present

## 2023-04-15 DIAGNOSIS — M542 Cervicalgia: Secondary | ICD-10-CM

## 2023-04-15 DIAGNOSIS — M62838 Other muscle spasm: Secondary | ICD-10-CM

## 2023-04-15 DIAGNOSIS — M6281 Muscle weakness (generalized): Secondary | ICD-10-CM | POA: Diagnosis not present

## 2023-04-15 DIAGNOSIS — N62 Hypertrophy of breast: Secondary | ICD-10-CM | POA: Diagnosis not present

## 2023-04-15 NOTE — Therapy (Signed)
OUTPATIENT PHYSICAL THERAPY CERVICAL TREATMENT   Patient Name: Sarah Hutchinson MRN: [id number] DOB:Jul 05, 1990, 32 y.o., female Today's Date: 04/15/2023  END OF SESSION:  PT End of Session - 04/15/23 0844     Visit Number 3    Date for PT Re-Evaluation 05/19/23    Authorization Type Blue Cross Blue Shield    Authorization - Number of Visits 60    PT Start Time 0800    PT Stop Time [phone]    PT Time Calculation (min) 44 min    Activity Tolerance Patient tolerated treatment well    Behavior During Therapy [facility] for tasks assessed/performed               Past Medical History:  Diagnosis Date   Anxiety 04/2021   Every since being diagnosed with PCOS ive had anxiety   Asthma    Asthma    Family history of adverse reaction to anesthesia    grandmother went under and never woke up   HSV-2 infection    PCOS (polycystic ovarian syndrome)    Past Surgical History:  Procedure Laterality Date   BIOPSY  06/19/2022   Procedure: BIOPSY;  Surgeon: Quentin Ore, MD;  Location: [facility];  Service: General;;   ESOPHAGOGASTRODUODENOSCOPY N/A 06/19/2022   Procedure: ESOPHAGOGASTRODUODENOSCOPY (EGD);  Surgeon: Quentin Ore, MD;  Location: [facility];  Service: General;  Laterality: N/A;   FOOT SURGERY Right    FRACTURE SURGERY  08/2014   Planters plate reconstructive (R) foot   UPPER GI ENDOSCOPY N/A 08/25/2022   Procedure: UPPER GI ENDOSCOPY;  Surgeon: Quentin Ore, MD;  Location: [facility];  Service: General;  Laterality: N/A;   Patient Active Problem List   Diagnosis Date Noted   Morbid obesity with BMI of 40.0-44.9, adult (HCC) 08/25/2022   HSV-2 (herpes simplex virus 2) infection 01/29/2022   PCOS (polycystic ovarian syndrome) 01/29/2022   Obesity (BMI 30-39.9) 01/29/2022   Prediabetes 01/29/2022   Anxiety 01/29/2022   Postpartum care following vaginal delivery 06/16/2015   Left wrist injury 12/04/2013    PCP: Alfredia Ferguson, PA-C  REFERRING  PROVIDER: Santiago Glad, MD  REFERRING DIAG: N62 (ICD-10-CM) - Macromastia  THERAPY DIAG:  Other muscle spasm  Muscle weakness (generalized)  Abnormal posture  Cervicalgia  Pain in thoracic spine  Rationale for Evaluation and Treatment: Rehabilitation  ONSET DATE: 07/2022  SUBJECTIVE:  SUBJECTIVE STATEMENT: Patient reports she is having a slight increase in pain today. She feels she left wrong last night. Pain is currently 8/10.   EVAL: Patient had weight loss surgery in February 2024. Since then she has experienced back and neck pain due to the size of her breasts. She has tried different bras and they haven't been helpful. Back pain is worse than the neck pain. Patient has indents on her shoulders from her bra straps. Sleep is uncomfortable. Patient occasionally has numbness in her upper trap area. Patient is looking to get her surgery at the beginning of 2025.  Hand dominance: Right  PERTINENT HISTORY:  Anxiety  PAIN: 04/15/2023 Are you having pain? Yes: NPRS scale: 8/10 Pain location: Upper thoracic spine bilaterally and lower C- spine into upper traps Pain description: achy Aggravating factors: Wearing more supportive bra - she feels more pressure Relieving factors: Without a bra in a semi recline position  PRECAUTIONS: None  RED FLAGS: None     WEIGHT BEARING RESTRICTIONS: No  FALLS:  Has patient fallen in last 6 months? No   OCCUPATION: Credit Specialist   PLOF:Independent Leisure: Cardio; walking  PATIENT GOALS: Exercises to help her pain  NEXT MD VISIT: PRN  OBJECTIVE:  Note: Objective measures were completed at Evaluation unless otherwise noted.  DIAGNOSTIC FINDINGS:  None  PATIENT SURVEYS:  Modified Oswestry 26/50   COGNITION: Overall  cognitive status: Within functional limits for tasks assessed    POSTURE: rounded shoulders and forward head  PALPATION: Thoracic: hypomobility T1-T6 Increased muscle spasms in bilateral upper trap & upper erector spinae  CERVICAL ROM: WFL; tightness with cervical flexion    UPPER EXTREMITY ROM: WFL  UPPER EXTREMITY MMT: Grossly 4+/5 bilaterally    TODAY'S TREATMENT:                                                                                                                              DATE:  04/15/2023 Review HEP- required minimal form cues for correction Posterior Capsule Shoulder stretch 2 x 20 sec each Doorway Pec stretch 2 x 20 sec Child's Pose 2 x 30 secs Lat Pull Downs 35# 2 x 10 Seated Matrix Rows 30# 2 x 10 3 way scap stabilization x 8 each green loop 4 D wall circles (up/down; clockwise; counterclockwise x 20 Standing D2 flexion green TB 2 x 10 each Standing shoulder ER with green loop 2 x 10 Standing shoulder flexion with abduction against blue loop 2 x 10 Grade V T4-T5; grade IV upper T-spine Seated thoracic extension against foam roll x 10 Foam roll roll ups against x 5  04/13/2023 Review HEP- required minimal form cues for correction Child's Pose 2 x 30 secs Grade V T3-T5- patient verbalized relief  Standing Rows with green TB 2 x 10 Standing shoulder extension with green TB 2 x 10 Standing shoulder abduction with green TB 2 x 10 3 way scap stabilization x 8 each green loop Standing  shoulder flexion with abduction against green loop 2 x 5 Lat Pull Downs 30# 2 x 10 Seated Matrix Rows 30# 2 x 10 UBE 5 mins (3 min forwards/ 2 mins)- PT present to discuss status   04/07/2023: Established Initial HEP  Grade V T3-T5- patient verbalized relief   PATIENT EDUCATION:  Education details: thoracic mobility; seated posture  Person educated: Patient Education method: Explanation, Demonstration, and Handouts Education comprehension: verbalized  understanding, returned demonstration, and needs further education  HOME EXERCISE PROGRAM: Access Code: [email or web address] URL: [email or web address] Date: 04/07/2023 Prepared by: Claude Manges  Exercises - Seated Scapular Retraction  - 2 x daily - 7 x weekly - 1 sets - 10 reps - Cat Cow  - 1 x daily - 7 x weekly - 1 sets - 10 reps - Quadruped Thoracic Rotation - Reach Under  - 2 x daily - 7 x weekly - 1 sets - 10 reps - Seated Upper Trapezius Stretch  - 1 x daily - 7 x weekly - 3 sets - 20-30 hold - Seated Levator Scapulae Stretch  - 1 x daily - 7 x weekly - 3 sets - 20-30 hold - Doorway Pec Stretch at 90 Degrees Abduction  - 1 x daily - 7 x weekly - 3 sets - 10 reps  ASSESSMENT:  CLINICAL IMPRESSION: Today's treatment session focused on periscapular strengthening. Patient was able to tolerate increased weight and resistance with majority of periscapular exercises today.  She was a little more painful today because she felt like she slept wrong. She felt relief doing thoracic and chest stretches in the beginning of treatment session. Required verbal cues for scapular engagement with overhead reaching activities. Patient will benefit from skilled PT to address the below impairments and improve overall function.     OBJECTIVE IMPAIRMENTS: decreased strength, hypomobility, increased muscle spasms, postural dysfunction, and pain.   ACTIVITY LIMITATIONS: sleeping  PARTICIPATION LIMITATIONS: community activity and occupation  PERSONAL FACTORS: 1 comorbidity: Anxiety  are also affecting patient's functional outcome.   REHAB POTENTIAL: Good  CLINICAL DECISION MAKING: Stable/uncomplicated  EVALUATION COMPLEXITY: Low   GOALS: Goals reviewed with patient? Yes  SHORT TERM GOALS: Target date: 04/28/2023  Patient will be independent with initial HEP. Baseline:  Goal status: INITIAL  2.  Patient will report < or = to 6/10 pain while sitting. Baseline: 8/10 Goal status:  INITIAL     LONG TERM GOALS: Target date: 05/19/2023 Patient will demonstrate independence in advanced HEP. Baseline:  Goal status: INITIAL  2.  Patient will report < or = to 4/10 pain while sitting Baseline: 8/10 Goal status: INITIAL  3.  Patient will demonstrate appropriate seated posture. Baseline: rounded shoulders Goal status: INITIAL  4.  Patient will report an 85% improvement in symptoms. Baseline:  Goal status: INITIAL     PLAN:  PT FREQUENCY: 2x/week  PT DURATION: 6 weeks  PLANNED INTERVENTIONS: Therapeutic exercises, Therapeutic activity, Neuromuscular re-education, Balance training, Gait training, Patient/Family education, Self Care, Joint mobilization, Joint manipulation, Stair training, Vestibular training, Canalith repositioning, Aquatic Therapy, Dry Needling, Electrical stimulation, Spinal manipulation, Spinal mobilization, Cryotherapy, Moist heat, scar mobilization, Vasopneumatic device, Traction, Ultrasound, Ionotophoresis 4mg /ml Dexamethasone, and Manual therapy  PLAN FOR NEXT SESSION: continue shoulder stability and strengthening  Claude Manges, PT 04/15/23 8:45 AM  [facility] [address], [address] [place], [place] [address] Phone # [phone] Fax [phone]

## 2023-04-20 ENCOUNTER — Ambulatory Visit: Payer: BC Managed Care – PPO | Admitting: Physical Therapy

## 2023-04-20 ENCOUNTER — Encounter: Payer: Self-pay | Admitting: Physical Therapy

## 2023-04-20 DIAGNOSIS — N62 Hypertrophy of breast: Secondary | ICD-10-CM | POA: Diagnosis not present

## 2023-04-20 DIAGNOSIS — M62838 Other muscle spasm: Secondary | ICD-10-CM

## 2023-04-20 DIAGNOSIS — M542 Cervicalgia: Secondary | ICD-10-CM | POA: Diagnosis not present

## 2023-04-20 DIAGNOSIS — M6281 Muscle weakness (generalized): Secondary | ICD-10-CM | POA: Diagnosis not present

## 2023-04-20 DIAGNOSIS — R293 Abnormal posture: Secondary | ICD-10-CM | POA: Diagnosis not present

## 2023-04-20 DIAGNOSIS — M546 Pain in thoracic spine: Secondary | ICD-10-CM

## 2023-04-20 NOTE — Therapy (Signed)
OUTPATIENT PHYSICAL THERAPY CERVICAL TREATMENT   Patient Name: Sarah Hutchinson MRN: [id number] DOB:1991/05/01, 32 y.o., female Today's Date: 04/20/2023  END OF SESSION:  PT End of Session - 04/20/23 1017     Visit Number 4    Date for PT Re-Evaluation 05/19/23    Authorization Type Blue [employer] Blue [employer]    Authorization - Number of Visits 60    PT Start Time [phone]    PT Stop Time 1014    PT Time Calculation (min) 43 min    Activity Tolerance Patient tolerated treatment well    Behavior During Therapy [facility] for tasks assessed/performed                Past Medical History:  Diagnosis Date   Anxiety 04/2021   Every since being diagnosed with PCOS ive had anxiety   Asthma    Asthma    Family history of adverse reaction to anesthesia    grandmother went under and never woke up   HSV-2 infection    PCOS (polycystic ovarian syndrome)    Past Surgical History:  Procedure Laterality Date   BIOPSY  06/19/2022   Procedure: BIOPSY;  Surgeon: Quentin Ore, MD;  Location: [facility];  Service: General;;   ESOPHAGOGASTRODUODENOSCOPY N/A 06/19/2022   Procedure: ESOPHAGOGASTRODUODENOSCOPY (EGD);  Surgeon: Quentin Ore, MD;  Location: [facility];  Service: General;  Laterality: N/A;   FOOT SURGERY Right    FRACTURE SURGERY  08/2014   Planters plate reconstructive (R) foot   UPPER GI ENDOSCOPY N/A 08/25/2022   Procedure: UPPER GI ENDOSCOPY;  Surgeon: Quentin Ore, MD;  Location: [facility];  Service: General;  Laterality: N/A;   Patient Active Problem List   Diagnosis Date Noted   Morbid obesity with BMI of 40.0-44.9, adult (HCC) 08/25/2022   HSV-2 (herpes simplex virus 2) infection 01/29/2022   PCOS (polycystic ovarian syndrome) 01/29/2022   Obesity (BMI 30-39.9) 01/29/2022   Prediabetes 01/29/2022   Anxiety 01/29/2022   Postpartum care following vaginal delivery 06/16/2015   Left wrist injury 12/04/2013    PCP: Alfredia Ferguson, PA-C  REFERRING  PROVIDER: Santiago Glad, MD  REFERRING DIAG: N62 (ICD-10-CM) - Macromastia  THERAPY DIAG:  Other muscle spasm  Muscle weakness (generalized)  Abnormal posture  Cervicalgia  Pain in thoracic spine  Rationale for Evaluation and Treatment: Rehabilitation  ONSET DATE: 07/2022  SUBJECTIVE:  SUBJECTIVE STATEMENT: Patient reports she feels very sore today. Her back and chest feel very sore today like she has done heavy lifting when she has not.    EVAL: Patient had weight loss surgery in February 2024. Since then she has experienced back and neck pain due to the size of her breasts. She has tried different bras and they haven't been helpful. Back pain is worse than the neck pain. Patient has indents on her shoulders from her bra straps. Sleep is uncomfortable. Patient occasionally has numbness in her upper trap area. Patient is looking to get her surgery at the beginning of 2025.  Hand dominance: Right  PERTINENT HISTORY:  Anxiety  PAIN: 04/15/2023 Are you having pain? Yes: NPRS scale: 8/10 Pain location: Upper thoracic spine bilaterally and lower C- spine into upper traps Pain description: achy Aggravating factors: Wearing more supportive bra - she feels more pressure Relieving factors: Without a bra in a semi recline position  PRECAUTIONS: None  RED FLAGS: None     WEIGHT BEARING RESTRICTIONS: No  FALLS:  Has patient fallen in last 6 months? No   OCCUPATION: Credit Specialist   PLOF:Independent Leisure: Cardio; walking  PATIENT GOALS: Exercises to help her pain  NEXT MD VISIT: PRN  OBJECTIVE:  Note: Objective measures were completed at Evaluation unless otherwise noted.  DIAGNOSTIC FINDINGS:  None  PATIENT SURVEYS:  Modified Oswestry 26/50    COGNITION: Overall cognitive status: Within functional limits for tasks assessed    POSTURE: rounded shoulders and forward head  PALPATION: Thoracic: hypomobility T1-T6 Increased muscle spasms in bilateral upper trap & upper erector spinae  CERVICAL ROM: WFL; tightness with cervical flexion    UPPER EXTREMITY ROM: WFL  UPPER EXTREMITY MMT: Grossly 4+/5 bilaterally    TODAY'S TREATMENT:                                                                                                                              DATE:  04/20/2023 UBE 6 mins (3 min forwards/ 3 mins)- PT present to discuss status Child's Pose 2 x 20 sec Thread the needle x 10 each side Open Books x 10 each side Seated Matrix Rows 30# 2 x 10 Lat Pull Downs 35# 2 x 10 Standing shoulder flexion with abduction against blue loop 2 x 8 3 way scap stabilization x 8 each blue loop Standing shoulder scaption 2# DB 2 x 10 Standing high row + shoulder ER 2# DB 2 x 8 Bent Over Ts 2# DB 2 x 10 KB Bottoms Up Press 2 x 8 each arm Seated thoracic extension against foam 2 x 10   04/15/2023 Review HEP- required minimal form cues for correction Posterior Capsule Shoulder stretch 2 x 20 sec each Doorway Pec stretch 2 x 20 sec Child's Pose 2 x 30 secs Lat Pull Downs 35# 2 x 10 Seated Matrix Rows 30# 2 x 10 3 way scap stabilization x 8 each green loop 4 D  wall circles (up/down; clockwise; counterclockwise x 20 Standing D2 flexion green TB 2 x 10 each Standing shoulder ER with green loop 2 x 10 Standing shoulder flexion with abduction against blue loop 2 x 10 Grade V T4-T5; grade IV upper T-spine Seated thoracic extension against foam roll x 10 Foam roll roll ups against x 5  04/13/2023 Review HEP- required minimal form cues for correction Child's Pose 2 x 30 secs Grade V T3-T5- patient verbalized relief  Standing Rows with green TB 2 x 10 Standing shoulder extension with green TB 2 x 10 Standing shoulder abduction  with green TB 2 x 10 3 way scap stabilization x 8 each green loop Standing shoulder flexion with abduction against green loop 2 x 5 Lat Pull Downs 30# 2 x 10 Seated Matrix Rows 30# 2 x 10 UBE 5 mins (3 min forwards/ 2 mins)- PT present to discuss status   PATIENT EDUCATION:  Education details: thoracic mobility; seated posture  Person educated: Patient Education method: Programmer, multimedia, Demonstration, and Handouts Education comprehension: verbalized understanding, returned demonstration, and needs further education  HOME EXERCISE PROGRAM: Access Code: [email or web address] URL: [email or web address] Date: 04/07/2023 Prepared by: Claude Manges  Exercises - Seated Scapular Retraction  - 2 x daily - 7 x weekly - 1 sets - 10 reps - Cat Cow  - 1 x daily - 7 x weekly - 1 sets - 10 reps - Quadruped Thoracic Rotation - Reach Under  - 2 x daily - 7 x weekly - 1 sets - 10 reps - Seated Upper Trapezius Stretch  - 1 x daily - 7 x weekly - 3 sets - 20-30 hold - Seated Levator Scapulae Stretch  - 1 x daily - 7 x weekly - 3 sets - 20-30 hold - Doorway Pec Stretch at 90 Degrees Abduction  - 1 x daily - 7 x weekly - 3 sets - 10 reps  ASSESSMENT:  CLINICAL IMPRESSION: Patient presented to therapy today a little more painful and sore. Educated patient on why she feels more sore when wearing a more supportive bra. Today's treatment session focused on periscapular strengthening. Patient tolerated treatment session well and did not verbalize any increased pain. Patient required moderate verbal cues for engagement of periscapular muscles. Patient will benefit from skilled PT to address the below impairments and improve overall function.      OBJECTIVE IMPAIRMENTS: decreased strength, hypomobility, increased muscle spasms, postural dysfunction, and pain.   ACTIVITY LIMITATIONS: sleeping  PARTICIPATION LIMITATIONS: community activity and occupation  PERSONAL FACTORS: 1 comorbidity: Anxiety  are also  affecting patient's functional outcome.   REHAB POTENTIAL: Good  CLINICAL DECISION MAKING: Stable/uncomplicated  EVALUATION COMPLEXITY: Low   GOALS: Goals reviewed with patient? Yes  SHORT TERM GOALS: Target date: 04/28/2023  Patient will be independent with initial HEP. Baseline:  Goal status: INITIAL  2.  Patient will report < or = to 6/10 pain while sitting. Baseline: 8/10 Goal status: INITIAL     LONG TERM GOALS: Target date: 05/19/2023 Patient will demonstrate independence in advanced HEP. Baseline:  Goal status: INITIAL  2.  Patient will report < or = to 4/10 pain while sitting Baseline: 8/10 Goal status: INITIAL  3.  Patient will demonstrate appropriate seated posture. Baseline: rounded shoulders Goal status: INITIAL  4.  Patient will report an 85% improvement in symptoms. Baseline:  Goal status: INITIAL     PLAN:  PT FREQUENCY: 2x/week  PT DURATION: 6 weeks  PLANNED INTERVENTIONS: Therapeutic exercises, Therapeutic activity, Neuromuscular re-education, Balance training,  Gait training, Patient/Family education, Self Care, Joint mobilization, Joint manipulation, Stair training, Vestibular training, Canalith repositioning, Aquatic Therapy, Dry Needling, Electrical stimulation, Spinal manipulation, Spinal mobilization, Cryotherapy, Moist heat, scar mobilization, Vasopneumatic device, Traction, Ultrasound, Ionotophoresis 4mg /ml Dexamethasone, and Manual therapy  PLAN FOR NEXT SESSION: continue shoulder stability and periscapular strengthening   Claude Manges, PT 04/20/23 10:17 AM  [facility] [address], [address] [place], [place] [address] Phone # [phone] Fax [phone]

## 2023-04-22 ENCOUNTER — Ambulatory Visit: Payer: BC Managed Care – PPO | Admitting: Physical Therapy

## 2023-04-22 ENCOUNTER — Encounter: Payer: Self-pay | Admitting: Physical Therapy

## 2023-04-22 DIAGNOSIS — M62838 Other muscle spasm: Secondary | ICD-10-CM

## 2023-04-22 DIAGNOSIS — M542 Cervicalgia: Secondary | ICD-10-CM

## 2023-04-22 DIAGNOSIS — R293 Abnormal posture: Secondary | ICD-10-CM

## 2023-04-22 DIAGNOSIS — M546 Pain in thoracic spine: Secondary | ICD-10-CM

## 2023-04-22 DIAGNOSIS — M6281 Muscle weakness (generalized): Secondary | ICD-10-CM | POA: Diagnosis not present

## 2023-04-22 DIAGNOSIS — N62 Hypertrophy of breast: Secondary | ICD-10-CM | POA: Diagnosis not present

## 2023-04-22 NOTE — Therapy (Signed)
OUTPATIENT PHYSICAL THERAPY CERVICAL TREATMENT   Patient Name: Sarah Hutchinson MRN: [id number] DOB:06/13/1991, 32 y.o., female Today's Date: 04/22/2023  END OF SESSION:  PT End of Session - 04/22/23 0820     Visit Number 5    Date for PT Re-Evaluation 05/19/23    Authorization Type Blue Cross Blue Shield    Authorization - Number of Visits 60    PT Start Time [phone]    PT Stop Time 0840    PT Time Calculation (min) 43 min    Activity Tolerance Patient tolerated treatment well    Behavior During Therapy [facility] for tasks assessed/performed                 Past Medical History:  Diagnosis Date   Anxiety 04/2021   Every since being diagnosed with PCOS ive had anxiety   Asthma    Asthma    Family history of adverse reaction to anesthesia    grandmother went under and never woke up   HSV-2 infection    PCOS (polycystic ovarian syndrome)    Past Surgical History:  Procedure Laterality Date   BIOPSY  06/19/2022   Procedure: BIOPSY;  Surgeon: Quentin Ore, MD;  Location: [facility];  Service: General;;   ESOPHAGOGASTRODUODENOSCOPY N/A 06/19/2022   Procedure: ESOPHAGOGASTRODUODENOSCOPY (EGD);  Surgeon: Quentin Ore, MD;  Location: [facility];  Service: General;  Laterality: N/A;   FOOT SURGERY Right    FRACTURE SURGERY  08/2014   Planters plate reconstructive (R) foot   UPPER GI ENDOSCOPY N/A 08/25/2022   Procedure: UPPER GI ENDOSCOPY;  Surgeon: Quentin Ore, MD;  Location: [facility];  Service: General;  Laterality: N/A;   Patient Active Problem List   Diagnosis Date Noted   Morbid obesity with BMI of 40.0-44.9, adult (HCC) 08/25/2022   HSV-2 (herpes simplex virus 2) infection 01/29/2022   PCOS (polycystic ovarian syndrome) 01/29/2022   Obesity (BMI 30-39.9) 01/29/2022   Prediabetes 01/29/2022   Anxiety 01/29/2022   Postpartum care following vaginal delivery 06/16/2015   Left wrist injury 12/04/2013    PCP: Alfredia Ferguson, PA-C  REFERRING  PROVIDER: Santiago Glad, MD  REFERRING DIAG: N62 (ICD-10-CM) - Macromastia  THERAPY DIAG:  Other muscle spasm  Muscle weakness (generalized)  Abnormal posture  Cervicalgia  Pain in thoracic spine  Rationale for Evaluation and Treatment: Rehabilitation  ONSET DATE: 07/2022  SUBJECTIVE:  SUBJECTIVE STATEMENT: Patient reports she is having pain in her mid thoracic spine today. It feels like pressure . Pain is currently 6-7/10.   EVAL: Patient had weight loss surgery in February 2024. Since then she has experienced back and neck pain due to the size of her breasts. She has tried different bras and they haven't been helpful. Back pain is worse than the neck pain. Patient has indents on her shoulders from her bra straps. Sleep is uncomfortable. Patient occasionally has numbness in her upper trap area. Patient is looking to get her surgery at the beginning of 2025.  Hand dominance: Right  PERTINENT HISTORY:  Anxiety  PAIN: 04/22/2023 Are you having pain? Yes: NPRS scale: 6-7/10 Pain location: Upper thoracic spine bilaterally and lower C- spine into upper traps Pain description: achy Aggravating factors: Wearing more supportive bra - she feels more pressure Relieving factors: Without a bra in a semi recline position  PRECAUTIONS: None  RED FLAGS: None     WEIGHT BEARING RESTRICTIONS: No  FALLS:  Has patient fallen in last 6 months? No   OCCUPATION: Credit Specialist   PLOF:Independent Leisure: Cardio; walking  PATIENT GOALS: Exercises to help her pain  NEXT MD VISIT: PRN  OBJECTIVE:  Note: Objective measures were completed at Evaluation unless otherwise noted.  DIAGNOSTIC FINDINGS:  None  PATIENT SURVEYS:  Modified Oswestry 26/50   COGNITION: Overall  cognitive status: Within functional limits for tasks assessed    POSTURE: rounded shoulders and forward head  PALPATION: Thoracic: hypomobility T1-T6 Increased muscle spasms in bilateral upper trap & upper erector spinae  CERVICAL ROM: WFL; tightness with cervical flexion    UPPER EXTREMITY ROM: WFL  UPPER EXTREMITY MMT: Grossly 4+/5 bilaterally    TODAY'S TREATMENT:                                                                                                                              DATE:  04/22/2023 UBE 6 mins (3 min forwards/ 3 mins)- PT present to discuss status Thread the needle x 10 each side Open Books x 10 each side Seated thoracic extension against foam 2 x 10 Seated Matrix Rows 30# 2 x 10 Lat Pull Downs 35# 2 x 10 Standing shoulder flexion with abduction against blue loop 2 x 8 3 way scap stabilization x 8 each blue loop Standing shoulder scaption 2# DB 2 x 10 Standing high row + shoulder ER 2# DB 2 x 10 Bent Over Ts 2# DB 2 x 10 Bent over single arm row 5# - 10# KB x 15 KB Bottoms Up Press 2 x 8 each arm Grade V T4-T5; grade IV upper T-spine    04/20/2023 UBE 6 mins (3 min forwards/ 3 mins)- PT present to discuss status Child's Pose 2 x 20 sec Thread the needle x 10 each side Open Books x 10 each side Seated Matrix Rows 30# 2 x 10 Lat Pull Downs 35# 2 x 10 Standing  shoulder flexion with abduction against blue loop 2 x 8 3 way scap stabilization x 8 each blue loop Standing shoulder scaption 2# DB 2 x 10 Standing high row + shoulder ER 2# DB 2 x 8 Bent Over Ts 2# DB 2 x 10 KB Bottoms Up Press 2 x 8 each arm Seated thoracic extension against foam 2 x 10   04/15/2023 Review HEP- required minimal form cues for correction Posterior Capsule Shoulder stretch 2 x 20 sec each Doorway Pec stretch 2 x 20 sec Child's Pose 2 x 30 secs Lat Pull Downs 35# 2 x 10 Seated Matrix Rows 30# 2 x 10 3 way scap stabilization x 8 each green loop 4 D wall circles  (up/down; clockwise; counterclockwise x 20 Standing D2 flexion green TB 2 x 10 each Standing shoulder ER with green loop 2 x 10 Standing shoulder flexion with abduction against blue loop 2 x 10 Grade V T4-T5; grade IV upper T-spine Seated thoracic extension against foam roll x 10 Foam roll roll ups against x 5   PATIENT EDUCATION:  Education details: thoracic mobility; seated posture  Person educated: Patient Education method: Programmer, multimedia, Demonstration, and Handouts Education comprehension: verbalized understanding, returned demonstration, and needs further education  HOME EXERCISE PROGRAM: Access Code: [email or web address] URL: [email or web address] Date: 04/07/2023 Prepared by: Claude Manges  Exercises - Seated Scapular Retraction  - 2 x daily - 7 x weekly - 1 sets - 10 reps - Cat Cow  - 1 x daily - 7 x weekly - 1 sets - 10 reps - Quadruped Thoracic Rotation - Reach Under  - 2 x daily - 7 x weekly - 1 sets - 10 reps - Seated Upper Trapezius Stretch  - 1 x daily - 7 x weekly - 3 sets - 20-30 hold - Seated Levator Scapulae Stretch  - 1 x daily - 7 x weekly - 3 sets - 20-30 hold - Doorway Pec Stretch at 90 Degrees Abduction  - 1 x daily - 7 x weekly - 3 sets - 10 reps  ASSESSMENT:  CLINICAL IMPRESSION: Mikea presented to therapy today with increased pain and soreness in her mid thoracic area. She normally experiences this pain when she wears a more supportive bra. Patient tolerated treatment session well and did not verbalized any pain or discomfort. Required moderate verbal and visual cues for correct exercise performance. Patient continues to make improvements with physical therapy. She reports feeling 42% better since starting therapy. Plan to discharge next treatment session.     OBJECTIVE IMPAIRMENTS: decreased strength, hypomobility, increased muscle spasms, postural dysfunction, and pain.   ACTIVITY LIMITATIONS: sleeping  PARTICIPATION LIMITATIONS: community activity  and occupation  PERSONAL FACTORS: 1 comorbidity: Anxiety  are also affecting patient's functional outcome.   REHAB POTENTIAL: Good  CLINICAL DECISION MAKING: Stable/uncomplicated  EVALUATION COMPLEXITY: Low   GOALS: Goals reviewed with patient? Yes  SHORT TERM GOALS: Target date: 04/28/2023  Patient will be independent with initial HEP. Baseline:  Goal status: MET 04/22/2023  2.  Patient will report < or = to 6/10 pain while sitting. Baseline: 8/10 Goal status: IN PROGRESS 04/22/2023     LONG TERM GOALS: Target date: 05/19/2023 Patient will demonstrate independence in advanced HEP. Baseline:  Goal status: INITIAL  2.  Patient will report < or = to 4/10 pain while sitting Baseline: 8/10 Goal status: INITIAL  3.  Patient will demonstrate appropriate seated posture. Baseline: rounded shoulders Goal status: INITIAL  4.  Patient will report an 85% improvement in symptoms.  Baseline:  Goal status: INITIAL     PLAN:  PT FREQUENCY: 2x/week  PT DURATION: 6 weeks  PLANNED INTERVENTIONS: Therapeutic exercises, Therapeutic activity, Neuromuscular re-education, Balance training, Gait training, Patient/Family education, Self Care, Joint mobilization, Joint manipulation, Stair training, Vestibular training, Canalith repositioning, Aquatic Therapy, Dry Needling, Electrical stimulation, Spinal manipulation, Spinal mobilization, Cryotherapy, Moist heat, scar mobilization, Vasopneumatic device, Traction, Ultrasound, Ionotophoresis 4mg /ml Dexamethasone, and Manual therapy  PLAN FOR NEXT SESSION: D/C next treatment session; update HEP  Claude Manges, PT 04/22/23 8:42 AM  [facility] [address] [place], [place] [address] Phone # [phone] Fax [phone]

## 2023-04-27 ENCOUNTER — Ambulatory Visit: Payer: BC Managed Care – PPO | Admitting: Physical Therapy

## 2023-04-27 ENCOUNTER — Encounter: Payer: Self-pay | Admitting: Physical Therapy

## 2023-04-27 DIAGNOSIS — M62838 Other muscle spasm: Secondary | ICD-10-CM

## 2023-04-27 DIAGNOSIS — M6281 Muscle weakness (generalized): Secondary | ICD-10-CM | POA: Diagnosis not present

## 2023-04-27 DIAGNOSIS — R293 Abnormal posture: Secondary | ICD-10-CM

## 2023-04-27 DIAGNOSIS — M542 Cervicalgia: Secondary | ICD-10-CM

## 2023-04-27 DIAGNOSIS — N62 Hypertrophy of breast: Secondary | ICD-10-CM | POA: Diagnosis not present

## 2023-04-27 DIAGNOSIS — M546 Pain in thoracic spine: Secondary | ICD-10-CM | POA: Diagnosis not present

## 2023-04-27 NOTE — Therapy (Signed)
OUTPATIENT PHYSICAL THERAPY CERVICAL TREATMENT/ DISCHARGE SUMMARY   Patient Name: Sarah Hutchinson MRN: [id number] DOB:04/10/1991, 32 y.o., female Today's Date: 04/27/2023  END OF SESSION:  PT End of Session - 04/27/23 1111     Visit Number 6    Date for PT Re-Evaluation 05/19/23    Authorization Type [employer]    Authorization - Number of Visits 60    PT Start Time 1103    PT Stop Time 1146    PT Time Calculation (min) 43 min    Activity Tolerance Patient tolerated treatment well    Behavior During Therapy WFL for tasks assessed/performed                 Past Medical History:  Diagnosis Date   Anxiety 04/2021   Every since being diagnosed with PCOS ive had anxiety   Asthma    Asthma    Family history of adverse reaction to anesthesia    grandmother went under and never woke up   HSV-2 infection    PCOS (polycystic ovarian syndrome)    Past Surgical History:  Procedure Laterality Date   BIOPSY  06/19/2022   Procedure: BIOPSY;  Surgeon: Quentin Ore, MD;  Location: [facility];  Service: General;;   ESOPHAGOGASTRODUODENOSCOPY N/A 06/19/2022   Procedure: ESOPHAGOGASTRODUODENOSCOPY (EGD);  Surgeon: Quentin Ore, MD;  Location: [facility];  Service: General;  Laterality: N/A;   FOOT SURGERY Right    FRACTURE SURGERY  08/2014   Planters plate reconstructive (R) foot   UPPER GI ENDOSCOPY N/A 08/25/2022   Procedure: UPPER GI ENDOSCOPY;  Surgeon: Quentin Ore, MD;  Location: [facility];  Service: General;  Laterality: N/A;   Patient Active Problem List   Diagnosis Date Noted   Morbid obesity with BMI of 40.0-44.9, adult (HCC) 08/25/2022   HSV-2 (herpes simplex virus 2) infection 01/29/2022   PCOS (polycystic ovarian syndrome) 01/29/2022   Obesity (BMI 30-39.9) 01/29/2022   Prediabetes 01/29/2022   Anxiety 01/29/2022   Postpartum care following vaginal delivery 06/16/2015   Left wrist injury 12/04/2013    PCP: Alfredia Ferguson,  PA-C  REFERRING PROVIDER: Santiago Glad, MD  REFERRING DIAG: N62 (ICD-10-CM) - Macromastia  THERAPY DIAG:  Other muscle spasm  Muscle weakness (generalized)  Abnormal posture  Cervicalgia  Pain in thoracic spine  Rationale for Evaluation and Treatment: Rehabilitation  ONSET DATE: 07/2022  SUBJECTIVE:  SUBJECTIVE STATEMENT: Patient reports she is doing good today. Her pain is currently 6.5/10.   EVAL: Patient had weight loss surgery in February 2024. Since then she has experienced back and neck pain due to the size of her breasts. She has tried different bras and they haven't been helpful. Back pain is worse than the neck pain. Patient has indents on her shoulders from her bra straps. Sleep is uncomfortable. Patient occasionally has numbness in her upper trap area. Patient is looking to get her surgery at the beginning of 2025.  Hand dominance: Right  PERTINENT HISTORY:  Anxiety  PAIN: 04/27/2023 Are you having pain? Yes: NPRS scale: 6.5/10 Pain location: Upper thoracic spine bilaterally and lower C- spine into upper traps Pain description: achy Aggravating factors: Wearing more supportive bra - she feels more pressure Relieving factors: Without a bra in a semi recline position  PRECAUTIONS: None  RED FLAGS: None     WEIGHT BEARING RESTRICTIONS: No  FALLS:  Has patient fallen in last 6 months? No   OCCUPATION: Credit Specialist   PLOF:Independent Leisure: Cardio; walking  PATIENT GOALS: Exercises to help her pain  NEXT MD VISIT: PRN  OBJECTIVE:  Note: Objective measures were completed at Evaluation unless otherwise noted.  DIAGNOSTIC FINDINGS:  None  PATIENT SURVEYS:  Modified Oswestry 26/50  Modified Oswestry 04/27/2023: 28/50  56%  COGNITION: Overall cognitive status: Within functional limits for tasks assessed    POSTURE: rounded shoulders and forward head  PALPATION: Thoracic: hypomobility T1-T6 Increased muscle spasms in bilateral upper trap & upper erector spinae  CERVICAL ROM: WFL; tightness with cervical flexion    UPPER EXTREMITY ROM: WFL  UPPER EXTREMITY MMT: Grossly 4+/5 bilaterally    TODAY'S TREATMENT:                                                                                                                              DATE:  04/27/2023 UBE 6 mins (3 min forwards/ 3 mins)- PT present to discuss status 3 way scap stabilization x 8 each blue loop Seated Matrix Rows 30# 2 x 10 Lat Pull Downs 35# 2 x 10 Bent over single arm row 5# - 10# KB x 15 Standing shoulder flexion with abduction against blue loop 2 x 8 Grade V T4-T5; grade IV upper T-spine Thread the needle x 10 each side KB Bottoms Up Press 5# 2 x 8 each arm Re administered  Oswestry Education of post op informational sheet and HEP   04/22/2023 UBE 6 mins (3 min forwards/ 3 mins)- PT present to discuss status Thread the needle x 10 each side Open Books x 10 each side Seated thoracic extension against foam 2 x 10 Seated Matrix Rows 30# 2 x 10 Lat Pull Downs 35# 2 x 10 Standing shoulder flexion with abduction against blue loop 2 x 8 3 way scap stabilization x 8 each blue loop Standing shoulder scaption 2# DB 2 x 10 Standing high row +  shoulder ER 2# DB 2 x 10 Bent Over Ts 2# DB 2 x 10 Bent over single arm row 5# - 10# KB x 15 KB Bottoms Up Press 2 x 8 each arm Grade V T4-T5; grade IV upper T-spine    04/20/2023 UBE 6 mins (3 min forwards/ 3 mins)- PT present to discuss status Child's Pose 2 x 20 sec Thread the needle x 10 each side Open Books x 10 each side Seated Matrix Rows 30# 2 x 10 Lat Pull Downs 35# 2 x 10 Standing shoulder flexion with abduction against blue loop 2 x 8 3 way scap stabilization x 8  each blue loop Standing shoulder scaption 2# DB 2 x 10 Standing high row + shoulder ER 2# DB 2 x 8 Bent Over Ts 2# DB 2 x 10 KB Bottoms Up Press 2 x 8 each arm Seated thoracic extension against foam 2 x 10   PATIENT EDUCATION:  Education details: thoracic mobility; seated posture  Person educated: Patient Education method: Programmer, multimedia, Facilities manager, and Handouts Education comprehension: verbalized understanding, returned demonstration, and needs further education  HOME EXERCISE PROGRAM: Access Code: [email or web address] URL: [email or web address] Date: 04/07/2023 Prepared by: Claude Manges  Exercises - Seated Scapular Retraction  - 2 x daily - 7 x weekly - 1 sets - 10 reps - Cat Cow  - 1 x daily - 7 x weekly - 1 sets - 10 reps - Quadruped Thoracic Rotation - Reach Under  - 2 x daily - 7 x weekly - 1 sets - 10 reps - Seated Upper Trapezius Stretch  - 1 x daily - 7 x weekly - 3 sets - 20-30 hold - Seated Levator Scapulae Stretch  - 1 x daily - 7 x weekly - 3 sets - 20-30 hold - Doorway Pec Stretch at 90 Degrees Abduction  - 1 x daily - 7 x weekly - 3 sets - 10 reps  Access Code: [email or web address] URL: [email or web address] Date: 04/27/2023 Prepared by: Claude Manges  Exercises - Seated Correct Posture  - 1 x daily - 7 x weekly - 1 reps - Seated Diaphragmatic Breathing  - 3 x daily - 7 x weekly - 1 sets - 10 reps - Standing Diaphragmatic Breathing  - 3 x daily - 7 x weekly - 1 sets - 10 reps - Standing Shoulder Flexion Wall Walk  - 3 x daily - 7 x weekly - 1 sets - 10 reps - 5 hold - Standing Shoulder Abduction Finger Walk at Wall  - 3 x daily - 7 x weekly - 1 sets - 10 reps - 5 hold - Seated Thoracic Lumbar Extension with Pectoralis Stretch  - 3 x daily - 7 x weekly - 1 sets - 5 reps - 10 hold - Chest and Bicep Stretch - Arms Behind Back  - 3 x daily - 7 x weekly - 1 sets - 5 reps - 10 hold - Seated Scapular Retraction  - 3 x daily - 7 x weekly - 1 sets - 10 reps - 5  hold  ASSESSMENT:  CLINICAL IMPRESSION: Rayanna has made good progress with physical therapy. Since starting therapy she has gone back to the gym and is incorporating cardio and strengthen training. She feels relief while doing exercises, but when she sits statically she feels very heavy. Educated and provided a handout for post op instructions after breast reduction surgery. Provided patient with exercises she can perform once her drains were removed. Patient has not met all of her goals due  to only having six sessions. Patient is independent with HEP and will continue exercises on her own. Patient will be discharged after this treatment session.     OBJECTIVE IMPAIRMENTS: decreased strength, hypomobility, increased muscle spasms, postural dysfunction, and pain.   ACTIVITY LIMITATIONS: sleeping  PARTICIPATION LIMITATIONS: community activity and occupation  PERSONAL FACTORS: 1 comorbidity: Anxiety  are also affecting patient's functional outcome.   REHAB POTENTIAL: Good  CLINICAL DECISION MAKING: Stable/uncomplicated  EVALUATION COMPLEXITY: Low   GOALS: Goals reviewed with patient? Yes  SHORT TERM GOALS: Target date: 04/28/2023  Patient will be independent with initial HEP. Baseline:  Goal status: MET 04/22/2023  2.  Patient will report < or = to 6/10 pain while sitting. Baseline: 8/10 Goal status: NOT MET - patient reports 7/10 pain    LONG TERM GOALS: Target date: 05/19/2023 Patient will demonstrate independence in advanced HEP. Baseline:  Goal status: GOAL MET 04/27/2023  2.  Patient will report < or = to 4/10 pain while sitting Baseline: 8/10 Goal status: NOT MET - patient reports 7/10 pain  3.  Patient will demonstrate appropriate seated posture. Baseline: rounded shoulders Goal status: GOAL MET 04/27/2023  4.  Patient will report an 85% improvement in symptoms. Baseline:  Goal status: NOT MET - patient reports 51% improvement     PLAN:  PT  FREQUENCY: 2x/week  PT DURATION: 6 weeks  PLANNED INTERVENTIONS: Therapeutic exercises, Therapeutic activity, Neuromuscular re-education, Balance training, Gait training, Patient/Family education, Self Care, Joint mobilization, Joint manipulation, Stair training, Vestibular training, Canalith repositioning, Aquatic Therapy, Dry Needling, Electrical stimulation, Spinal manipulation, Spinal mobilization, Cryotherapy, Moist heat, scar mobilization, Vasopneumatic device, Traction, Ultrasound, Ionotophoresis 4mg /ml Dexamethasone, and Manual therapy   PLAN FOR NEXT SESSION: D/C next treatment session; update HEP  PHYSICAL THERAPY DISCHARGE SUMMARY  Visits from Start of Care: 6  Current functional level related to goals / functional outcomes: Iolene has made good progress with physical therapy. She had incorporated a gym routine and plans to continue HEP at the gym. Patient has not met all of her goals due to only having six sessions.   Remaining deficits: Pain levels   Education / Equipment: Updated HEP for post op see above   Patient agrees to discharge. Patient goals were partially met. Patient is being discharged due to being pleased with the current functional level.  Claude Manges, PT 04/27/23 2:04 PM  [facility] [address] [place], [place] [address] Phone # [phone] Fax [phone]

## 2023-05-03 ENCOUNTER — Ambulatory Visit (INDEPENDENT_AMBULATORY_CARE_PROVIDER_SITE_OTHER): Payer: BC Managed Care – PPO | Admitting: Student

## 2023-05-03 VITALS — Wt 194.2 lb

## 2023-05-03 DIAGNOSIS — N62 Hypertrophy of breast: Secondary | ICD-10-CM | POA: Diagnosis not present

## 2023-05-03 DIAGNOSIS — R21 Rash and other nonspecific skin eruption: Secondary | ICD-10-CM

## 2023-05-03 DIAGNOSIS — M549 Dorsalgia, unspecified: Secondary | ICD-10-CM | POA: Diagnosis not present

## 2023-05-03 DIAGNOSIS — M542 Cervicalgia: Secondary | ICD-10-CM | POA: Diagnosis not present

## 2023-05-03 DIAGNOSIS — Z9884 Bariatric surgery status: Secondary | ICD-10-CM | POA: Diagnosis not present

## 2023-05-03 NOTE — Progress Notes (Signed)
   Referring Provider Alfredia Ferguson, PA-C [address] [place],  [place] [address]   CC:  Chief Complaint  Patient presents with   Follow-up      Sarah Hutchinson is an 32 y.o. female.  HPI: Patient is a 31 y.o. year old female here for follow up after completing physical therapy for pain related to macromastia.   She was seen for initial consult by 03/25/2023 by Dr. Ladona Ridgel.  At that time, patient reported that she had underwent gastric bariatric surgery in February 2024.  She reported that since her weight loss, she noted increasing difficulty with upper back and neck pain to the large size of her breasts.  She reported that it started to cause difficulty with posture and finding bras that fit appropriately and patient stated that she was interested in bilateral breast reduction.  Physical therapy was ordered for the patient.  Today, patient reports she is doing well.  She states that she completed physical therapy.  She states that during physical therapy, she felt that some of her symptoms improved, but when she was not actively at physical therapy, she notes that she was still experiencing shoulder, back, and neck pain.  Patient also stated that she gets intermittent rashes underneath her breasts that are irritating.  She states that she would still like to move forward with a breast reduction.  Patient denies any recent changes in her health.  She denies any fevers or chills.   Review of Systems General: Denies any fevers or chills MSK: Endorses ongoing back and neck discomfort Skin: Reports intermittent rashes  Physical Exam    05/03/2023    8:38 AM 03/25/2023    8:35 AM 01/12/2023    3:10 PM  Vitals with BMI  Height  5\' 8"  5\' 8"   Weight 194 lbs 3 oz 199 lbs 10 oz 207 lbs  BMI  30.36 31.48  Systolic  115 117  Diastolic  76 76  Pulse  100 82    General:  No acute distress,  Alert and oriented, Non-Toxic, Normal speech and affect Psych: Normal behavior and  mood Respiratory: No increased WOB MSK: Ambulatory  Assessment/Plan  Patient is interested in pursuing surgical intervention for bilateral breast reduction. Patient has completed at least 6 weeks of physical therapy for pain related to macromastia.  Discussed with patient we would submit to insurance for authorization, discussed approval could take up to 6 weeks.   Laurena Spies 05/03/2023, 8:47 AM

## 2023-05-12 ENCOUNTER — Encounter: Payer: Self-pay | Admitting: *Deleted

## 2023-05-17 DIAGNOSIS — J019 Acute sinusitis, unspecified: Secondary | ICD-10-CM | POA: Diagnosis not present

## 2023-05-17 DIAGNOSIS — J209 Acute bronchitis, unspecified: Secondary | ICD-10-CM | POA: Diagnosis not present

## 2023-05-17 DIAGNOSIS — B3731 Acute candidiasis of vulva and vagina: Secondary | ICD-10-CM | POA: Diagnosis not present

## 2023-05-17 DIAGNOSIS — Z03818 Encounter for observation for suspected exposure to other biological agents ruled out: Secondary | ICD-10-CM | POA: Diagnosis not present

## 2023-05-17 DIAGNOSIS — J029 Acute pharyngitis, unspecified: Secondary | ICD-10-CM | POA: Diagnosis not present

## 2023-06-22 DIAGNOSIS — N62 Hypertrophy of breast: Secondary | ICD-10-CM | POA: Diagnosis not present

## 2023-06-22 DIAGNOSIS — L304 Erythema intertrigo: Secondary | ICD-10-CM | POA: Diagnosis not present

## 2023-07-05 ENCOUNTER — Ambulatory Visit: Payer: BC Managed Care – PPO | Admitting: Skilled Nursing Facility1

## 2023-07-08 DIAGNOSIS — Z01812 Encounter for preprocedural laboratory examination: Secondary | ICD-10-CM | POA: Diagnosis not present

## 2023-08-03 ENCOUNTER — Other Ambulatory Visit: Payer: Self-pay | Admitting: Physician Assistant

## 2023-08-03 ENCOUNTER — Encounter: Payer: Self-pay | Admitting: Family Medicine

## 2023-08-03 ENCOUNTER — Ambulatory Visit: Payer: BC Managed Care – PPO | Admitting: Family Medicine

## 2023-08-03 VITALS — BP 119/75 | HR 95 | Resp 18 | Ht 68.0 in | Wt 194.9 lb

## 2023-08-03 DIAGNOSIS — J4520 Mild intermittent asthma, uncomplicated: Secondary | ICD-10-CM

## 2023-08-03 MED ORDER — AIRSUPRA 90-80 MCG/ACT IN AERO
2.0000 | INHALATION_SPRAY | RESPIRATORY_TRACT | 2 refills | Status: AC | PRN
Start: 2023-08-03 — End: ?

## 2023-08-03 MED ORDER — ALBUTEROL SULFATE HFA 108 (90 BASE) MCG/ACT IN AERS
2.0000 | INHALATION_SPRAY | Freq: Four times a day (QID) | RESPIRATORY_TRACT | 6 refills | Status: AC | PRN
Start: 2023-08-03 — End: ?

## 2023-08-03 NOTE — Telephone Encounter (Signed)
 Medication Refill -  Most Recent Primary Care Visit:  Provider: DONZELLA LAURAINE SAILOR  Department: [id number]  Visit Type: OFFICE VISIT  Date: 01/12/2023  Medication: albuterol  (VENTOLIN  HFA) 108 (90 Base) MCG/ACT inhaler  Pot scheduled for surgery out of town on 08/10/2023 and requesting prescription to be filled by Friday 08/06/2023. Pt advised of turnaround time.   Has the patient contacted their pharmacy? Yes Told to contact office to see if it could be expedited.   Is this the correct pharmacy for this prescription? Yes  This is the patient's preferred pharmacy:  [id number] [place], [place] - [address] AT [facility] OF [address] [place] [address] Phone: [phone] Fax: [phone]   Has the prescription been filled recently? No  Is the patient out of the medication? No  Has the patient been seen for an appointment in the last year OR does the patient have an upcoming appointment? Yes  Can we respond through MyChart? Yes  Agent: Please be advised that Rx refills may take up to 3 business days. We ask that you follow-up with your pharmacy.
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
## 2023-08-03 NOTE — Telephone Encounter (Signed)
Patient called, left VM to return the call to the office to schedule with a different provider. Medication already requested by the pharmacy today in a separate refill encounter, routed to the office for a provider to approve the refill.

## 2023-08-03 NOTE — Assessment & Plan Note (Addendum)
 Asthma, well-controlled with albuterol  inhaler used primarily as pre-exercise prophylaxis. No recent exacerbations. Last pulmonary function test 2-3 years ago showed good results. Discussed adding Supra inhaler (albuterol  and steroid combination) during illness to prevent exacerbations. Informed about thrush risk with steroid use and the need to rinse mouth after use. Chronic, stable  - Prescribe albuterol  inhaler, 108 mcg, two inhalations every six hours as needed - Prescribe AirSupra  inhaler (albuterol  and steroid combination), 90-80 mcg, two puffs up to six times a day during illness - Instruct to rinse mouth after using Supra inhaler to prevent thrush

## 2023-08-03 NOTE — Progress Notes (Addendum)
 Established patient visit   Patient: Sarah Hutchinson   DOB: [birth year]   33 y.o. Female  MRN: [id number] Visit Date: 08/03/2023  Today's healthcare provider: Mimi Alt, MD   Chief Complaint  Patient presents with   Asthma   Subjective       Discussed the use of AI scribe software for clinical note transcription with the patient, who gave verbal consent to proceed.  History of Present Illness   Sarah Hutchinson is a 33 year old female with asthma who presents for medication refills.  She is seeking a refill for her asthma medication, specifically albuterol . She uses albuterol  108 mcg, two inhalations every six hours as needed for wheezing. She typically uses it daily, especially before exercise, as she experiences difficulty breathing if she does not use it prior to gym activities. She has been following this regimen for about fifteen years.  Her asthma symptoms worsen when she is sick, at which point she may use a breathing treatment with albuterol , although she has not needed to use it recently.  Her current medications include calcium carbonate 1500 mg daily, a multivitamin, and orthocycline 0.25-0.35 mg-mcg daily. She believes she has enough refills for her birth control until July.  She last had a pulmonary function test two to three years ago, which she passed.         Past Medical History:  Diagnosis Date   Anxiety 04/2021   Every since being diagnosed with PCOS ive had anxiety   Asthma    Asthma    Family history of adverse reaction to anesthesia    grandmother went under and never woke up   HSV-2 infection    PCOS (polycystic ovarian syndrome)     Medications: Outpatient Medications Prior to Visit  Medication Sig   calcium carbonate (OSCAL) 1500 (600 Ca) MG TABS tablet Take by mouth 2 (two) times daily with a meal.   Multiple Vitamin (MULTIVITAMIN) capsule Take 1 capsule by mouth daily.   [DISCONTINUED] albuterol  (VENTOLIN  HFA) 108 (90 Base)  MCG/ACT inhaler INHALE 2 INHALATIONS INTO THE LUNGS EVERY 6 HOURS AS NEEDED FOR WHEEZING   [DISCONTINUED] norgestimate -ethinyl estradiol  (ORTHO-CYCLEN) 0.25-35 MG-MCG tablet Take 1 tablet by mouth daily.   [DISCONTINUED] cyclobenzaprine  (FLEXERIL ) 5 MG tablet Take 1-2 tablets at night before sleep. (Patient not taking: Reported on 05/03/2023)   [DISCONTINUED] predniSONE  (DELTASONE ) 50 MG tablet Take 1 tablet (50 mg total) by mouth daily.   No facility-administered medications prior to visit.    Review of Systems  Last CBC Lab Results  Component Value Date   WBC 10.4 08/26/2022   HGB 12.0 08/26/2022   HCT 38.3 08/26/2022   MCV 86.3 08/26/2022   MCH 27.0 08/26/2022   RDW 13.6 08/26/2022   PLT 329 08/26/2022   Last metabolic panel Lab Results  Component Value Date   GLUCOSE 99 08/17/2022   NA 139 08/17/2022   K 3.9 08/17/2022   CL 106 08/17/2022   CO2 22 08/17/2022   BUN 13 08/17/2022   CREATININE 0.80 08/25/2022   GFRNONAA >60 08/25/2022   CALCIUM 9.2 08/17/2022   PROT 7.5 08/17/2022   ALBUMIN 4.1 08/17/2022   BILITOT 0.4 08/17/2022   ALKPHOS 79 08/17/2022   AST 23 08/17/2022   ALT 19 08/17/2022   ANIONGAP 11 08/17/2022        Objective    BP 119/75   Pulse 95   Resp 18   Ht 5\' 8"  (1.727 m)   Wt  194 lb 14.4 oz (88.4 kg)   SpO2 97%   BMI 29.63 kg/m  BP Readings from Last 3 Encounters:  08/03/23 119/75  03/25/23 115/76  01/12/23 117/76   Wt Readings from Last 3 Encounters:  08/03/23 194 lb 14.4 oz (88.4 kg)  05/03/23 194 lb 3.2 oz (88.1 kg)  03/25/23 199 lb 9.6 oz (90.5 kg)        Physical Exam  Physical Exam   VITALS: Blood pressure within normal limits. CHEST: No crackles, no wheezing, normal respiratory effort, breathing comfortably on room air. CARDIOVASCULAR: Regular rate and rhythm with no murmurs.       No results found for any visits on 08/03/23.  Assessment & Plan     Problem List Items Addressed This Visit       Respiratory    Mild intermittent asthma without complication - Primary   Asthma, well-controlled with albuterol  inhaler used primarily as pre-exercise prophylaxis. No recent exacerbations. Last pulmonary function test 2-3 years ago showed good results. Discussed adding Supra inhaler (albuterol  and steroid combination) during illness to prevent exacerbations. Informed about thrush risk with steroid use and the need to rinse mouth after use. Chronic, stable  - Prescribe albuterol  inhaler, 108 mcg, two inhalations every six hours as needed - Prescribe AirSupra  inhaler (albuterol  and steroid combination), 90-80 mcg, two puffs up to six times a day during illness - Instruct to rinse mouth after using Supra inhaler to prevent thrush      Relevant Medications   albuterol  (VENTOLIN  HFA) 108 (90 Base) MCG/ACT inhaler   Albuterol -Budesonide (AIRSUPRA ) 90-80 MCG/ACT AERO       General Health Maintenance Up-to-date with birth control and normal Pap smear from 2022. - Schedule physical exam in July 2025 - Schedule next Pap smear in November 2025         Return in about 5 months (around 12/31/2023).         Mimi Alt, MD  [facility] [phone] (phone) [phone] (fax)  [facility]

## 2023-08-11 ENCOUNTER — Other Ambulatory Visit: Payer: Self-pay | Admitting: Plastic Surgery

## 2023-08-11 DIAGNOSIS — N62 Hypertrophy of breast: Secondary | ICD-10-CM | POA: Diagnosis not present

## 2023-08-12 LAB — SURGICAL PATHOLOGY

## 2023-11-10 ENCOUNTER — Other Ambulatory Visit: Payer: Self-pay | Admitting: Physician Assistant

## 2023-11-10 ENCOUNTER — Other Ambulatory Visit: Payer: Self-pay | Admitting: Family Medicine

## 2023-11-10 MED ORDER — NORGESTIMATE-ETH ESTRADIOL 0.25-35 MG-MCG PO TABS: 1.0000 | ORAL_TABLET | Freq: Every day | ORAL | 0 refills | Status: DC

## 2023-11-16 ENCOUNTER — Encounter: Payer: Self-pay | Admitting: Family Medicine

## 2023-11-16 ENCOUNTER — Ambulatory Visit: Admitting: Family Medicine

## 2023-11-16 VITALS — BP 122/72 | HR 94 | Ht 68.0 in | Wt 196.0 lb

## 2023-11-16 DIAGNOSIS — Z3041 Encounter for surveillance of contraceptive pills: Secondary | ICD-10-CM

## 2023-11-16 DIAGNOSIS — J4520 Mild intermittent asthma, uncomplicated: Secondary | ICD-10-CM

## 2023-11-16 MED ORDER — NORGESTIMATE-ETH ESTRADIOL 0.25-35 MG-MCG PO TABS: 1.0000 | ORAL_TABLET | Freq: Every day | ORAL | 3 refills | Status: DC

## 2023-11-16 NOTE — Progress Notes (Signed)
 Established patient visit   Patient: Sarah Hutchinson   DOB: February 20, 1991   33 y.o. Female  MRN: [id number] Visit Date: 11/16/2023  Today's healthcare provider: Mimi Alt, MD   Chief Complaint  Patient presents with   Medication Refill    Needs meds refill    Subjective     HPI     Medication Refill    Additional comments: Needs meds refill       Last edited by Bart Lieu, CMA on 11/16/2023  2:21 PM.       Discussed the use of AI scribe software for clinical note transcription with the patient, who gave verbal consent to proceed.  History of Present Illness Sarah Hutchinson is a 33 year old female who presents for a refill of her contraception pill.  She is currently taking noradjustamate, ethanol estradiol , 0.25-0.35 micrograms daily, and finds it effective, having used it for eight and a half years without any issues or side effects such as weight changes.  She has a history of asthma, which is currently well-controlled. She uses an inhaler and finds obtaining it from the pharmacy easy. She knows how to use it properly.  She is concerned about her A1c levels due to a family history of diabetes. Her A1c is currently 5.7, down from 6.0 in 2022. She is mindful of her diet, avoiding excessive sweets, pasta, and bread, although she notes a new craving for sweets post-surgery.  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 The rash appeared on her side, not on her genitals, and has not recurred. She has not experienced any further symptoms and is uncertain about the diagnosis, having researched and joined support groups without finding similar cases.     Past Medical History:  Diagnosis Date   Anxiety 04/2021   Every since being diagnosed with PCOS ive had anxiety   Asthma    Asthma    Family history of adverse reaction to anesthesia    grandmother went under and never woke up    HSV-2 infection    PCOS (polycystic ovarian syndrome)     Medications: Outpatient Medications Prior to Visit  Medication Sig   albuterol  (VENTOLIN  HFA) 108 (90 Base) MCG/ACT inhaler Inhale 2 puffs into the lungs every 6 (six) hours as needed for wheezing or shortness of breath.   Albuterol -Budesonide (AIRSUPRA ) 90-80 MCG/ACT AERO Inhale 2 puffs into the lungs as needed.   calcium carbonate (OSCAL) 1500 (600 Ca) MG TABS tablet Take by mouth 2 (two) times daily with a meal.   Multiple Vitamin (MULTIVITAMIN) capsule Take 1 capsule by mouth daily.   [DISCONTINUED] norgestimate -ethinyl estradiol  (ESTARYLLA) 0.25-35 MG-MCG tablet Take 1 tablet by mouth daily.   No facility-administered medications prior to visit.    Review of Systems  Last metabolic panel Lab Results  Component Value Date   GLUCOSE 99 08/17/2022   NA 139 08/17/2022   K 3.9 08/17/2022   CL 106 08/17/2022   CO2 22 08/17/2022   BUN 13 08/17/2022   CREATININE 0.80 08/25/2022   GFRNONAA >60 08/25/2022   CALCIUM 9.2 08/17/2022   PROT 7.5 08/17/2022   ALBUMIN 4.1 08/17/2022   BILITOT 0.4 08/17/2022   ALKPHOS 79 08/17/2022   AST 23 08/17/2022   ALT 19 08/17/2022   ANIONGAP 11 08/17/2022   Last lipids No results found for: "CHOL", "HDL", "LDLCALC", "LDLDIRECT", "TRIG", "CHOLHDL" Last hemoglobin A1c Lab Results  Component Value Date   HGBA1C 5.6 08/17/2022   Last thyroid  functions No results found for: "TSH", "T3TOTAL", "T4TOTAL", "THYROIDAB"      Objective    BP 122/72   Pulse 94   Ht 5\' 8"  (1.727 m)   Wt 196 lb (88.9 kg)   SpO2 100%   BMI 29.80 kg/m      Physical Exam  General: Alert, no acute distress Cardio: Normal S1 and S2, RRR, no r/m/g Pulm: CTAB, normal work of breathing ABD: soft, abdomen is not distended, there is no tenderness to palpation, normal BS     No results found for any visits on 11/16/23.  Assessment & Plan     Problem List Items Addressed This Visit       Respiratory    Mild intermittent asthma without complication   Other Visit Diagnoses       Uses oral contraception    -  Primary   Relevant Medications   norgestimate -ethinyl estradiol  (ESTARYLLA) 0.25-35 MG-MCG tablet   Other Relevant Orders   POCT urine pregnancy       Assessment & Plan Prediabetes A1c is 5.7%, indicating prediabetes. She has a family history of diabetes and is concerned about progression. Discussed lifestyle modifications to prevent progression, including weight management and dietary changes. Encouraged moderation in consumption of sweets and carbohydrates. - Schedule a physical exam before the end of the year. - Recheck A1c and cholesterol during the physical exam. - Encourage lifestyle modifications, including weight management and dietary changes.  Asthma Asthma is well-controlled with no reported issues. She has an inhaler and knows how to use it. No recent exacerbations or difficulties obtaining medication. - Continue current asthma management plan. - Use inhaler as needed, with a maximum of 12 puffs per day. - Rinse mouth after inhaler use.  HSV-2 She reports a vesicular rash on the side, initially thought to be shingles, but tested positive for HSV-2. No genital lesions reported. Rash has not recurred. Discussed the possibility of a false positive and the need for further testing if rash reappears. - If rash reappears, perform a culture to confirm diagnosis.     Return in about 6 months (around 05/18/2024) for CPE.         Mimi Alt, MD  [facility] [phone] (phone) [phone] (fax)  [facility]

## 2023-11-17 LAB — POCT URINE PREGNANCY: Preg Test, Ur: NEGATIVE

## 2024-01-09 ENCOUNTER — Other Ambulatory Visit: Payer: Self-pay

## 2024-01-09 ENCOUNTER — Emergency Department
Admission: EM | Admit: 2024-01-09 | Discharge: 2024-01-09 | Discharge status: Against Medical Advice | Attending: Emergency Medicine | Admitting: Emergency Medicine

## 2024-01-09 DIAGNOSIS — Z5321 Procedure and treatment not carried out due to patient leaving prior to being seen by health care provider: Secondary | ICD-10-CM | POA: Diagnosis not present

## 2024-01-09 DIAGNOSIS — R110 Nausea: Secondary | ICD-10-CM | POA: Insufficient documentation

## 2024-01-09 DIAGNOSIS — R112 Nausea with vomiting, unspecified: Secondary | ICD-10-CM | POA: Diagnosis not present

## 2024-01-09 DIAGNOSIS — E860 Dehydration: Secondary | ICD-10-CM | POA: Diagnosis not present

## 2024-01-09 DIAGNOSIS — R197 Diarrhea, unspecified: Secondary | ICD-10-CM | POA: Diagnosis not present

## 2024-01-09 LAB — COMPREHENSIVE METABOLIC PANEL WITH GFR
ALT: 19 U/L (ref 0–44)
AST: 34 U/L (ref 15–41)
Albumin: 4 g/dL (ref 3.5–5.0)
Alkaline Phosphatase: 69 U/L (ref 38–126)
Anion gap: 10 (ref 5–15)
BUN: 13 mg/dL (ref 6–20)
CO2: 23 mmol/L (ref 22–32)
Calcium: 8.9 mg/dL (ref 8.9–10.3)
Chloride: 104 mmol/L (ref 98–111)
Creatinine, Ser: 0.74 mg/dL (ref 0.44–1.00)
GFR, Estimated: >60 mL/min (ref >60)
Glucose, Bld: 91 mg/dL (ref 70–99)
Potassium: 3.7 mmol/L (ref 3.5–5.1)
Sodium: 137 mmol/L (ref 135–145)
Total Bilirubin: 0.8 mg/dL (ref 0.0–1.2)
Total Protein: 7.2 g/dL (ref 6.5–8.1)

## 2024-01-09 LAB — CBC
HCT: 43.6 % (ref 36.0–46.0)
Hemoglobin: 14.4 g/dL (ref 12.0–15.0)
MCH: 28.4 pg (ref 26.0–34.0)
MCHC: 33 g/dL (ref 30.0–36.0)
MCV: 86 fL (ref 80.0–100.0)
Platelets: 201 K/uL (ref 150–400)
RBC: 5.07 MIL/uL (ref 3.87–5.11)
RDW: 12.6 % (ref 11.5–15.5)
WBC: 2.9 K/uL — ABNORMAL LOW (ref 4.0–10.5)
nRBC: 0 % (ref 0.0–0.2)

## 2024-01-09 LAB — RESP PANEL BY RT-PCR (RSV, FLU A&B, COVID)  RVPGX2
Influenza A by PCR: NEGATIVE
Influenza B by PCR: NEGATIVE
Resp Syncytial Virus by PCR: NEGATIVE
SARS Coronavirus 2 by RT PCR: NEGATIVE

## 2024-01-09 LAB — LIPASE, BLOOD: Lipase: 25 U/L (ref 11–51)

## 2024-01-09 NOTE — ED Triage Notes (Signed)
 Pt to ed from home via POV for nausea and diarrhea. Pt is caox4, in no acute distress and ambulatory in triage,

## 2024-01-09 NOTE — ED Notes (Signed)
 Pt states she is leaving and will come back another time.

## 2024-01-10 ENCOUNTER — Telehealth: Payer: Self-pay | Admitting: Emergency Medicine

## 2024-01-10 ENCOUNTER — Emergency Department
Admission: EM | Admit: 2024-01-10 | Discharge: 2024-01-10 | Disposition: A | Discharge status: Home / Self Care | Attending: Emergency Medicine | Admitting: Emergency Medicine

## 2024-01-10 ENCOUNTER — Other Ambulatory Visit: Payer: Self-pay

## 2024-01-10 DIAGNOSIS — A084 Viral intestinal infection, unspecified: Secondary | ICD-10-CM | POA: Diagnosis not present

## 2024-01-10 DIAGNOSIS — J45909 Unspecified asthma, uncomplicated: Secondary | ICD-10-CM | POA: Insufficient documentation

## 2024-01-10 DIAGNOSIS — R112 Nausea with vomiting, unspecified: Secondary | ICD-10-CM | POA: Diagnosis not present

## 2024-01-10 LAB — URINALYSIS, ROUTINE W REFLEX MICROSCOPIC
Bilirubin Urine: NEGATIVE
Glucose, UA: NEGATIVE mg/dL
Ketones, ur: 5 mg/dL — AB
Nitrite: NEGATIVE
Protein, ur: 100 mg/dL — AB
Specific Gravity, Urine: 1.029 (ref 1.005–1.030)
Squamous Epithelial / HPF: >50 /HPF (ref 0–5)
WBC, UA: >50 WBC/hpf (ref 0–5)
pH: 5 (ref 5.0–8.0)

## 2024-01-10 LAB — POC URINE PREG, ED: Preg Test, Ur: NEGATIVE

## 2024-01-10 MED ORDER — ONDANSETRON HCL 4 MG/2ML IJ SOLN
4.0000 mg | Freq: Once | INTRAMUSCULAR | Status: AC
  Administered 2024-01-10: 4 mg via INTRAVENOUS
  Filled 2024-01-10: qty 2

## 2024-01-10 MED ORDER — SODIUM CHLORIDE 0.9 % IV SOLN: Freq: Once | INTRAVENOUS | Status: AC

## 2024-01-10 NOTE — Telephone Encounter (Signed)
 Called patient due to left emergency department before provider exam to inquire about condition and follow up plans. Left message asking her to call me.

## 2024-01-10 NOTE — Telephone Encounter (Signed)
 Adina called me back.  Says she still feels pretty bad.  Discussed lab results--she also noticed the low wbc.  I explained that I do not know why this is, but I recommend she see a provider.  She will try her pcp first, since the labs are complete.   If pcp cannot see her, she will return herer.

## 2024-01-10 NOTE — ED Notes (Signed)
 See triage note  Presents with some n/v which started on Saturday. Last time vomited was PTA this am

## 2024-01-10 NOTE — ED Provider Notes (Signed)
 [facility] Provider Note    Event Date/Time   First MD Initiated Contact with Patient 01/10/24 [phone]     (approximate)   History   Abnormal Lab   HPI  Sarah Hutchinson is a 33 y.o. female with a past medical history of asthma who presents with complaints of nausea vomiting diarrhea which started 2 days ago.  Initially she had nausea then followed by diarrhea.  Came yesterday but left prior to being seen.  Reports multiple episodes of diarrhea throughout the night, no further vomiting because has not been eating or drinking.     Physical Exam   Triage Vital Signs: ED Triage Vitals  Encounter Vitals Group     BP 01/10/24 0929 126/79     Girls Systolic BP Percentile --      Girls Diastolic BP Percentile --      Boys Systolic BP Percentile --      Boys Diastolic BP Percentile --      Pulse Rate 01/10/24 0929 91     Resp 01/10/24 0929 18     Temp 01/10/24 0929 (!) 97.5 F (36.4 C)     Temp Source 01/10/24 0929 Oral     SpO2 01/10/24 0929 99 %     Weight 01/10/24 0934 87.5 kg (193 lb)     Height 01/10/24 0934 1.727 m (5' 8)     Head Circumference --      Peak Flow --      Pain Score 01/10/24 0933 0     Pain Loc --      Pain Education --      Exclude from Growth Chart --     Most recent vital signs: Vitals:   01/10/24 0929  BP: 126/79  Pulse: 91  Resp: 18  Temp: (!) 97.5 F (36.4 C)  SpO2: 99%     General: Awake, no distress.  CV:  Good peripheral perfusion.  Resp:  Normal effort.  Abd:  No distention.  Soft, nontender, reassuring exam Other:     ED Results / Procedures / Treatments   Labs (all labs ordered are listed, but only abnormal results are displayed) Labs Reviewed  URINALYSIS, ROUTINE W REFLEX MICROSCOPIC - Abnormal; Notable for the following components:      Result Value   Color, Urine AMBER (*)    APPearance TURBID (*)    Hgb urine dipstick LARGE (*)    Ketones, ur 5 (*)    Protein, ur 100 (*)    Leukocytes,Ua  MODERATE (*)    Bacteria, UA MANY (*)    All other components within normal limits  POC URINE PREG, ED     EKG     RADIOLOGY     PROCEDURES:  Critical Care performed:   Procedures   MEDICATIONS ORDERED IN ED: Medications  0.9 %  sodium chloride  infusion ( Intravenous New Bag/Given 01/10/24 1029)  ondansetron  (ZOFRAN ) injection 4 mg (4 mg Intravenous Given 01/10/24 1116)     IMPRESSION / MDM / ASSESSMENT AND PLAN / ED COURSE  I reviewed the triage vital signs and the nursing notes. Patient's presentation is most consistent with acute illness / injury with system symptoms.  Patient presents with nausea vomiting diarrhea as detailed above, suspicious for viral gastroenteritis, abdominal exam is quite reassuring.  Lab work reviewed from yesterday, overall unremarkable, white blood cell count of 2.9 nonspecific, she will have this rechecked with PCP in a few weeks when she is feeling better.  Urinalysis appears to be contaminated, she has no dysuria  Patient treated with IV fluids and IV Zofran  and had significant improvement, no indication for admission at this time, appropriate for discharge with outpatient follow-up, return precautions discussed.        FINAL CLINICAL IMPRESSION(S) / ED DIAGNOSES   Final diagnoses:  Viral gastroenteritis     Rx / DC Orders   ED Discharge Orders     None        Note:  This document was prepared using Dragon voice recognition software and may include unintentional dictation errors.   Arlander Charleston, MD 01/10/24 1200

## 2024-01-10 NOTE — ED Triage Notes (Signed)
 Pt to ED for nausea, diarrhea since 3 days. Had labs here last night but LWBS. She was called this AM and told her WBC count was low and to come back to ED. Per EDP, no redraw needed today. UA sent, was not collected last night. Ot has vomited twice today, no diarrhea today. Had 10 episodes of diarrhea yesterday. No pain.

## 2024-01-12 ENCOUNTER — Inpatient Hospital Stay: Admitting: Family Medicine

## 2024-02-09 ENCOUNTER — Encounter: Payer: Self-pay | Admitting: Family Medicine

## 2024-03-07 DIAGNOSIS — Z4889 Encounter for other specified surgical aftercare: Secondary | ICD-10-CM | POA: Diagnosis not present

## 2024-03-07 DIAGNOSIS — Z903 Acquired absence of stomach [part of]: Secondary | ICD-10-CM | POA: Diagnosis not present

## 2024-04-06 DIAGNOSIS — M62452 Contracture of muscle, left thigh: Secondary | ICD-10-CM | POA: Diagnosis not present

## 2024-04-06 DIAGNOSIS — M545 Low back pain, unspecified: Secondary | ICD-10-CM | POA: Diagnosis not present

## 2024-04-06 DIAGNOSIS — M542 Cervicalgia: Secondary | ICD-10-CM | POA: Diagnosis not present

## 2024-04-06 DIAGNOSIS — M9906 Segmental and somatic dysfunction of lower extremity: Secondary | ICD-10-CM | POA: Diagnosis not present

## 2024-04-06 DIAGNOSIS — M9903 Segmental and somatic dysfunction of lumbar region: Secondary | ICD-10-CM | POA: Diagnosis not present

## 2024-04-06 DIAGNOSIS — M62830 Muscle spasm of back: Secondary | ICD-10-CM | POA: Diagnosis not present

## 2024-04-20 DIAGNOSIS — F4323 Adjustment disorder with mixed anxiety and depressed mood: Secondary | ICD-10-CM | POA: Diagnosis not present

## 2024-04-26 DIAGNOSIS — F4323 Adjustment disorder with mixed anxiety and depressed mood: Secondary | ICD-10-CM | POA: Diagnosis not present

## 2024-05-16 DIAGNOSIS — F4323 Adjustment disorder with mixed anxiety and depressed mood: Secondary | ICD-10-CM | POA: Diagnosis not present

## 2024-05-20 ENCOUNTER — Encounter: Payer: Self-pay | Admitting: Emergency Medicine

## 2024-05-20 ENCOUNTER — Ambulatory Visit
Admission: EM | Admit: 2024-05-20 | Discharge: 2024-05-20 | Disposition: A | Discharge status: Home / Self Care | Attending: Emergency Medicine | Admitting: Emergency Medicine

## 2024-05-20 DIAGNOSIS — R0789 Other chest pain: Secondary | ICD-10-CM

## 2024-05-20 DIAGNOSIS — M542 Cervicalgia: Secondary | ICD-10-CM

## 2024-05-20 MED ORDER — KETOROLAC TROMETHAMINE 30 MG/ML IJ SOLN
30.0000 mg | Freq: Once | INTRAMUSCULAR | Status: AC
  Administered 2024-05-20: 30 mg via INTRAMUSCULAR

## 2024-05-20 MED ORDER — CYCLOBENZAPRINE HCL 10 MG PO TABS: 10.0000 mg | ORAL_TABLET | Freq: Two times a day (BID) | ORAL | 0 refills | Status: AC | PRN

## 2024-05-20 MED ORDER — PREDNISONE 10 MG (21) PO TBPK: ORAL_TABLET | Freq: Every day | ORAL | 0 refills | Status: DC

## 2024-05-20 NOTE — Discharge Instructions (Addendum)
 Your pain is most likely caused by irritation to the muscles.  You have been given an injection of Toradol  to reduce inflammation and pain and ideally will see improvement within the next 30 minutes to 1 hour  Start tomorrow take prednisone  every morning with food as directed to continue the process above avoid ibuprofen  but may use Tylenol  and topical medicine  You may use muscle relaxant twice daily as needed for additional comfort  You may use heating pad in 15 minute intervals as needed for additional comfort, within the first 2-3 days you may find comfort in using ice in 10-15 minutes over affected area  Begin stretching affected area daily for 10 minutes as tolerated to further loosen muscles   When lying down place pillow underneath the areas of concern  Can try sleeping without pillow on firm mattress as this keeps the spine in alignment  Practice good posture: head back, shoulders back, chest forward, pelvis back and weight distributed evenly on both legs  If pain persist after recommended treatment or reoccurs if may be beneficial to follow up with orthopedic specialist for evaluation, this doctor specializes in the bones and can manage your symptoms long-term with options such as but not limited to imaging, medications or physical therapy
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
## 2024-05-22 ENCOUNTER — Encounter: Admitting: Family Medicine

## 2024-05-22 ENCOUNTER — Ambulatory Visit (INDEPENDENT_AMBULATORY_CARE_PROVIDER_SITE_OTHER): Admitting: Family Medicine

## 2024-05-22 ENCOUNTER — Telehealth: Payer: Self-pay | Admitting: Family Medicine

## 2024-05-22 ENCOUNTER — Encounter: Payer: Self-pay | Admitting: Family Medicine

## 2024-05-22 VITALS — BP 115/73 | HR 101 | Ht 68.0 in | Wt 194.5 lb

## 2024-05-22 DIAGNOSIS — S161XXD Strain of muscle, fascia and tendon at neck level, subsequent encounter: Secondary | ICD-10-CM | POA: Diagnosis not present

## 2024-05-22 DIAGNOSIS — R519 Headache, unspecified: Secondary | ICD-10-CM

## 2024-05-22 DIAGNOSIS — F4323 Adjustment disorder with mixed anxiety and depressed mood: Secondary | ICD-10-CM | POA: Diagnosis not present

## 2024-05-22 DIAGNOSIS — S161XXA Strain of muscle, fascia and tendon at neck level, initial encounter: Secondary | ICD-10-CM

## 2024-05-22 MED ORDER — BUTALBITAL-APAP-CAFFEINE 50-325-40 MG PO TABS: 1.0000 | ORAL_TABLET | Freq: Four times a day (QID) | ORAL | 0 refills | Status: AC | PRN
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
## 2024-05-22 NOTE — Telephone Encounter (Signed)
 I attempted to re-schedule Ghazal's physical. I left a message asking her to return my call at [phone] to re-schedule.

## 2024-05-30 ENCOUNTER — Encounter: Payer: Self-pay | Admitting: Family Medicine

## 2024-05-30 ENCOUNTER — Encounter: Admitting: Family Medicine

## 2024-05-30 ENCOUNTER — Other Ambulatory Visit (HOSPITAL_COMMUNITY)
Admission: RE | Admit: 2024-05-30 | Discharge: 2024-05-30 | Disposition: A | Discharge status: Home / Self Care | Source: Ambulatory Visit | Attending: Family Medicine | Admitting: Family Medicine

## 2024-05-30 ENCOUNTER — Ambulatory Visit: Admitting: Family Medicine

## 2024-05-30 VITALS — BP 111/71 | HR 93 | Temp 98.6°F | Ht 68.0 in | Wt 194.7 lb

## 2024-05-30 DIAGNOSIS — F5102 Adjustment insomnia: Secondary | ICD-10-CM | POA: Diagnosis not present

## 2024-05-30 DIAGNOSIS — Z124 Encounter for screening for malignant neoplasm of cervix: Secondary | ICD-10-CM | POA: Diagnosis not present

## 2024-05-30 DIAGNOSIS — Z9884 Bariatric surgery status: Secondary | ICD-10-CM | POA: Diagnosis not present

## 2024-05-30 DIAGNOSIS — Z0000 Encounter for general adult medical examination without abnormal findings: Secondary | ICD-10-CM

## 2024-05-30 DIAGNOSIS — F419 Anxiety disorder, unspecified: Secondary | ICD-10-CM | POA: Diagnosis not present

## 2024-05-30 DIAGNOSIS — R7303 Prediabetes: Secondary | ICD-10-CM | POA: Diagnosis not present

## 2024-05-30 DIAGNOSIS — Z01411 Encounter for gynecological examination (general) (routine) with abnormal findings: Secondary | ICD-10-CM | POA: Diagnosis not present

## 2024-05-30 DIAGNOSIS — Z131 Encounter for screening for diabetes mellitus: Secondary | ICD-10-CM | POA: Diagnosis not present

## 2024-05-30 MED ORDER — HYDROXYZINE PAMOATE 50 MG PO CAPS: 50.0000 mg | ORAL_CAPSULE | Freq: Every evening | ORAL | 3 refills | Status: AC | PRN

## 2024-05-30 MED ORDER — BUSPIRONE HCL 10 MG PO TABS: 10.0000 mg | ORAL_TABLET | Freq: Two times a day (BID) | ORAL | 3 refills | Status: AC

## 2024-05-30 NOTE — Patient Instructions (Signed)
 To keep you healthy, please keep in mind the following health maintenance items that you are due for:   Health Maintenance Due  Topic Date Due   Hepatitis C Screening  Never done   Pneumococcal Vaccine (1 of 2 - PCV) Never done   Hepatitis B Vaccines 19-59 Average Risk (1 of 3 - 19+ 3-dose series) Never done   Cervical Cancer Screening (HPV/Pap Cotest)  04/29/2024     Best Wishes,   Dr. Lang
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
## 2024-05-31 ENCOUNTER — Ambulatory Visit: Payer: Self-pay | Admitting: Family Medicine

## 2024-05-31 LAB — HEMOGLOBIN A1C
Est. average glucose Bld gHb Est-mCnc: 105 mg/dL
Hgb A1c MFr Bld: 5.3 % (ref 4.8–5.6)

## 2024-05-31 LAB — LIPID PANEL
Chol/HDL Ratio: 3.1 ratio (ref 0.0–4.4)
Cholesterol, Total: 175 mg/dL (ref 100–199)
HDL: 57 mg/dL (ref >39)
LDL Chol Calc (NIH): 89 mg/dL (ref 0–99)
Triglycerides: 168 mg/dL — ABNORMAL HIGH (ref 0–149)
VLDL Cholesterol Cal: 29 mg/dL (ref 5–40)

## 2024-05-31 LAB — VITAMIN B12: Vitamin B-12: 189 pg/mL — ABNORMAL LOW (ref 232–1245)

## 2024-05-31 LAB — TSH+FREE T4
Free T4: 1.18 ng/dL (ref 0.82–1.77)
TSH: 1.22 u[IU]/mL (ref 0.450–4.500)

## 2024-05-31 LAB — VITAMIN D 25 HYDROXY (VIT D DEFICIENCY, FRACTURES): Vit D, 25-Hydroxy: 52.9 ng/mL (ref 30.0–100.0)

## 2024-06-02 LAB — CYTOLOGY - PAP
Chlamydia: NEGATIVE
Comment: NEGATIVE
Comment: NEGATIVE
Comment: NEGATIVE
Comment: NORMAL
Diagnosis: NEGATIVE
High risk HPV: NEGATIVE
Neisseria Gonorrhea: NEGATIVE
Trichomonas: NEGATIVE

## 2024-06-07 DIAGNOSIS — F4323 Adjustment disorder with mixed anxiety and depressed mood: Secondary | ICD-10-CM | POA: Diagnosis not present

## 2024-06-20 DIAGNOSIS — F4323 Adjustment disorder with mixed anxiety and depressed mood: Secondary | ICD-10-CM | POA: Diagnosis not present

## 2024-08-01 ENCOUNTER — Ambulatory Visit (INDEPENDENT_AMBULATORY_CARE_PROVIDER_SITE_OTHER): Admitting: Family Medicine

## 2024-08-01 ENCOUNTER — Encounter: Payer: Self-pay | Admitting: Family Medicine

## 2024-08-01 VITALS — BP 111/77 | HR 99 | Ht 68.0 in | Wt 196.5 lb

## 2024-08-01 DIAGNOSIS — F5102 Adjustment insomnia: Secondary | ICD-10-CM | POA: Diagnosis not present

## 2024-08-01 DIAGNOSIS — Z3041 Encounter for surveillance of contraceptive pills: Secondary | ICD-10-CM | POA: Diagnosis not present

## 2024-08-01 DIAGNOSIS — F419 Anxiety disorder, unspecified: Secondary | ICD-10-CM

## 2024-08-01 DIAGNOSIS — N393 Stress incontinence (female) (male): Secondary | ICD-10-CM | POA: Diagnosis not present

## 2024-08-01 DIAGNOSIS — E538 Deficiency of other specified B group vitamins: Secondary | ICD-10-CM | POA: Insufficient documentation

## 2024-08-01 MED ORDER — NORGESTIMATE-ETH ESTRADIOL 0.25-35 MG-MCG PO TABS: 1.0000 | ORAL_TABLET | Freq: Every day | ORAL | 3 refills | Status: AC

## 2024-08-01 NOTE — Assessment & Plan Note (Signed)
 Anxiety disorder with insomnia Chronic anxiety with notable improvement in GAD-7 score from 16 to 6. Phq9 score low, 2 down from 11.   Anxiety is better managed with buspirone  10 mg twice daily. Insomnia persists with variable response to hydroxyzine  50 mg at bedtime. Reports nocturia, potentially contributing to sleep disturbances. Open to adjusting hydroxyzine  dosage and considering trazodone if current regimen is ineffective after 3-4 weeks. - Continue buspirone  10 mg twice daily.

## 2024-08-01 NOTE — Progress Notes (Signed)
 "  Established Patient Office Visit  Patient ID: Lauralye Hutchinson, female    DOB: June 13, 1991  Age: 34 y.o. MRN: [id number] PCP: Sharma Coyer, MD  Chief Complaint  Patient presents with   Medical Management of Chronic Issues    Patient is present for f/u anxiety and insomnia, patient notes improvement in anxiety but hit or miss with sleep.     Subjective:     HPI  Discussed the use of AI scribe software for clinical note transcription with the patient, who gave verbal consent to proceed.  History of Present Illness Sarah Hutchinson is a 34 year old female with chronic anxiety and insomnia who presents for follow-up.  She has experienced a significant improvement in her anxiety symptoms, with her GAD-7 score decreasing from 16 to 6. She is currently taking buspirone  10 mg twice daily for anxiety. Although she still experiences occasional anxiety, it is less frequent and severe. She feels more relaxed, especially at night, after taking her medication.  Regarding her insomnia, she finds the effectiveness of hydroxyzine  50 mg at bedtime inconsistent. She takes it two to three times during the week, avoiding weekends. Initially, it was very effective, but now its efficacy varies. Taking it around 9 PM helps her relax and get sleepy within an hour, but she often wakes up multiple times at night to urinate, which disrupts her sleep. She attributes this frequent urination to an overactive bladder, a condition she has experienced since before her pregnancy.  She has a history of vitamin B12 deficiency, identified in December, and takes 1000 mcg of oral supplementation daily. Her triglycerides were also noted to be above the goal of 150 mg/dL.  Socially, she recently went through a breakup with her son's father, which has been challenging due to their shared parenting responsibilities. She moved out two weeks ago and hopes this change will further reduce her anxiety. She has a 86-year-old son  and is not planning to have more children.  No issues with coughing, sneezing, or laughing causing urinary leakage. Reports frequent urination at night, waking up two to three times.     ROS        08/01/2024   11:03 AM 05/30/2024    3:43 PM 08/03/2023    2:13 PM 01/29/2022    8:36 AM  GAD 7 : Generalized Anxiety Score  Nervous, Anxious, on Edge 1 3  0  2   Control/stop worrying 1 3  0  1   Worry too much - different things 1 3  0  2   Trouble relaxing 1 3  0  2   Restless 1 0  0  2   Easily annoyed or irritable 1 3  0  1   Afraid - awful might happen 0 1  0  0   Total GAD 7 Score 6 16 0 10  Anxiety Difficulty Not difficult at all Very difficult  Not difficult at all     Data saved with a previous flowsheet row definition      08/01/2024   11:03 AM 05/30/2024    3:43 PM 08/03/2023    2:13 PM  PHQ9 SCORE ONLY  PHQ-9 Total Score 2 11 0      Data saved with a previous flowsheet row definition     Objective:     BP 111/77 (BP Location: Left Arm, Patient Position: Sitting, Cuff Size: Normal)   Pulse 99   Ht 5' 8 (1.727 m)   Wt 196 lb  8 oz (89.1 kg)   LMP 07/31/2024 (Exact Date)   SpO2 99%   BMI 29.88 kg/m    Physical Exam Vitals reviewed.  Constitutional:      General: She is not in acute distress.    Appearance: Normal appearance. She is normal weight. She is not ill-appearing, toxic-appearing or diaphoretic.     Comments: Well groomed, calmly sitting   Cardiovascular:     Rate and Rhythm: Normal rate and regular rhythm.  Pulmonary:     Effort: Pulmonary effort is normal. No respiratory distress.     Breath sounds: No wheezing, rhonchi or rales.  Neurological:     Mental Status: She is alert and oriented to person, place, and time.  Psychiatric:        Attention and Perception: Attention and perception normal. She is attentive. She does not perceive auditory or visual hallucinations.        Mood and Affect: Mood and affect normal.        Speech: Speech normal.         Behavior: Behavior normal. Behavior is cooperative.        Thought Content: Thought content normal. Thought content is not paranoid or delusional. Thought content does not include homicidal or suicidal ideation. Thought content does not include homicidal or suicidal plan.        Judgment: Judgment normal.      No results found for any visits on 08/01/24.    The ASCVD Risk score (Arnett DK, et al., 2019) failed to calculate for the following reasons:   The 2019 ASCVD risk score is only valid for ages 50 to 31  Outpatient Encounter Medications as of 08/01/2024  Medication Sig   albuterol  (VENTOLIN  HFA) 108 (90 Base) MCG/ACT inhaler Inhale 2 puffs into the lungs every 6 (six) hours as needed for wheezing or shortness of breath.   Albuterol -Budesonide (AIRSUPRA ) 90-80 MCG/ACT AERO Inhale 2 puffs into the lungs as needed.   busPIRone  (BUSPAR ) 10 MG tablet Take 1 tablet (10 mg total) by mouth 2 (two) times daily.   calcium carbonate (OSCAL) 1500 (600 Ca) MG TABS tablet Take by mouth 2 (two) times daily with a meal.   cyclobenzaprine  (FLEXERIL ) 10 MG tablet Take 1 tablet (10 mg total) by mouth 2 (two) times daily as needed for muscle spasms.   hydrOXYzine  (VISTARIL ) 50 MG capsule Take 1 capsule (50 mg total) by mouth at bedtime as needed.   Multiple Vitamin (MULTIVITAMIN) capsule Take 1 capsule by mouth daily.   [DISCONTINUED] norgestimate -ethinyl estradiol  (ESTARYLLA) 0.25-35 MG-MCG tablet Take 1 tablet by mouth daily.   norgestimate -ethinyl estradiol  (ESTARYLLA) 0.25-35 MG-MCG tablet Take 1 tablet by mouth daily.   [DISCONTINUED] predniSONE  (STERAPRED UNI-PAK 21 TAB) 10 MG (21) TBPK tablet Take by mouth daily. Take 6 tabs by mouth daily  for 1 days, then 5 tabs for 1 days, then 4 tabs for 1 days, then 3 tabs for 1 days, 2 tabs for 1 days, then 1 tab by mouth daily for 1 days   No facility-administered encounter medications on file as of 08/01/2024.       Assessment & Plan:   Problem  List Items Addressed This Visit     Adjustment insomnia    Chronic insomnia - Adjust hydroxyzine  to 100 mg at bedtime as needed for sleep. - Will consider trazodone 35-50mg  at bedtime PRN if hydroxyzine  is ineffective after 6 week follow up - Scheduled a virtual follow-up in 6 weeks to reassess sleep and  anxiety management.      Anxiety - Primary   Anxiety disorder with insomnia Chronic anxiety with notable improvement in GAD-7 score from 16 to 6. Phq9 score low, 2 down from 11.   Anxiety is better managed with buspirone  10 mg twice daily. Insomnia persists with variable response to hydroxyzine  50 mg at bedtime. Reports nocturia, potentially contributing to sleep disturbances. Open to adjusting hydroxyzine  dosage and considering trazodone if current regimen is ineffective after 3-4 weeks. - Continue buspirone  10 mg twice daily.      Vitamin B12 deficiency   Vitamin B12 deficiency Chronic vitamin B12 deficiency identified in recent labs. - Start oral vitamin B12 supplementation 1000 mcg daily. - Will recheck vitamin B12 levels in March.      Other Visit Diagnoses       Uses oral contraception       Relevant Medications   norgestimate -ethinyl estradiol  (ESTARYLLA) 0.25-35 MG-MCG tablet     Stress incontinence       Relevant Orders   Ambulatory referral to Physical Therapy       Assessment and Plan Assessment & Plan     Stress incontinence Chronic condition  Reports stress incontinence, particularly postpartum, with symptoms of needing to wipe again after urination and nocturia. No urinary leakage with coughing, sneezing, or laughing. Open to pelvic floor therapy. - Referred to pelvic floor therapy for stress incontinence management.      Return in about 6 weeks (around 09/12/2024) for Insomnia(hydroxyzine ) virtual please.    Rockie Agent, MD [facility]  "
